# Patient Record
Sex: Female | Born: 1975 | Race: White | Hispanic: No | Marital: Married | Smoking: Never smoker
Health system: Southern US, Community
[De-identification: ages and names within clinical notes are randomized; demographics above are authoritative.]

## PROBLEM LIST (undated history)

## (undated) DIAGNOSIS — M041 Periodic fever syndromes: Secondary | ICD-10-CM

## (undated) DIAGNOSIS — M1990 Unspecified osteoarthritis, unspecified site: Secondary | ICD-10-CM

## (undated) HISTORY — DX: Unspecified osteoarthritis, unspecified site: M19.90

## (undated) HISTORY — DX: Periodic fever syndromes: M04.1

## (undated) HISTORY — PX: APPENDECTOMY: SHX54

---

## 2015-10-08 ENCOUNTER — Other Ambulatory Visit: Payer: Self-pay | Admitting: Obstetrics & Gynecology

## 2015-10-08 DIAGNOSIS — Z1231 Encounter for screening mammogram for malignant neoplasm of breast: Secondary | ICD-10-CM

## 2015-10-15 ENCOUNTER — Ambulatory Visit
Admission: RE | Admit: 2015-10-15 | Discharge: 2015-10-15 | Disposition: A | Discharge status: Home / Self Care | Payer: No Typology Code available for payment source | Source: Ambulatory Visit | Attending: Obstetrics & Gynecology | Admitting: Obstetrics & Gynecology

## 2015-10-15 DIAGNOSIS — Z1231 Encounter for screening mammogram for malignant neoplasm of breast: Secondary | ICD-10-CM

## 2015-10-16 ENCOUNTER — Other Ambulatory Visit: Payer: Self-pay | Admitting: Obstetrics & Gynecology

## 2015-10-16 DIAGNOSIS — R928 Other abnormal and inconclusive findings on diagnostic imaging of breast: Secondary | ICD-10-CM

## 2015-10-21 ENCOUNTER — Other Ambulatory Visit: Payer: Self-pay

## 2015-10-29 ENCOUNTER — Other Ambulatory Visit (HOSPITAL_COMMUNITY): Payer: Self-pay | Admitting: *Deleted

## 2015-10-29 DIAGNOSIS — R928 Other abnormal and inconclusive findings on diagnostic imaging of breast: Secondary | ICD-10-CM

## 2015-10-30 ENCOUNTER — Ambulatory Visit
Admission: RE | Admit: 2015-10-30 | Discharge: 2015-10-30 | Disposition: A | Discharge status: Home / Self Care | Payer: No Typology Code available for payment source | Source: Ambulatory Visit | Attending: Obstetrics and Gynecology | Admitting: Obstetrics and Gynecology

## 2015-10-30 ENCOUNTER — Encounter (HOSPITAL_COMMUNITY): Payer: Self-pay

## 2015-10-30 ENCOUNTER — Other Ambulatory Visit (HOSPITAL_COMMUNITY): Payer: Self-pay | Admitting: Obstetrics and Gynecology

## 2015-10-30 ENCOUNTER — Ambulatory Visit (HOSPITAL_COMMUNITY)
Admission: RE | Admit: 2015-10-30 | Discharge: 2015-10-30 | Disposition: A | Discharge status: Home / Self Care | Payer: No Typology Code available for payment source | Source: Ambulatory Visit | Attending: Obstetrics and Gynecology | Admitting: Obstetrics and Gynecology

## 2015-10-30 VITALS — BP 120/82 | Temp 98.3°F | Ht 64.0 in | Wt 123.0 lb

## 2015-10-30 DIAGNOSIS — R928 Other abnormal and inconclusive findings on diagnostic imaging of breast: Secondary | ICD-10-CM

## 2015-10-30 DIAGNOSIS — Z1239 Encounter for other screening for malignant neoplasm of breast: Secondary | ICD-10-CM

## 2015-10-30 NOTE — Progress Notes (Signed)
Patient referred to [facility] by the [facility] due to recommending additional imaging of the right breast. Screening mammogram completed 10/15/2015 at the Breast Center.  Pap Smear:  Pap smear not completed today. Last Pap smear was in September 2015 at [facility] and normal per patient. Per patient has no history of an abnormal Pap smear. No Pap smear results in EPIC.  Physical exam: Breasts Breasts symmetrical. No skin abnormalities bilateral breasts. No nipple retraction bilateral breasts. No nipple discharge bilateral breasts. No lymphadenopathy. No lumps palpated bilateral breasts. No complaints of pain or tenderness on exam. Referred patient to the [facility] of [facility] for right breast diagnostic mammogram per recommendation. Appointment scheduled for Thursday, October 30, 2015 at 1300.        Pelvic/Bimanual No Pap smear completed today since last Pap smear was in September 2015 per patient. Pap smear not indicated per BCCCP guidelines.   Smoking History: Patient has never smoked.  Patient Navigation: Patient education provided. Access to services provided for patient through [facility] program.

## 2015-10-30 NOTE — Patient Instructions (Signed)
Educational materials on breast self awareness given. Explained to [employer] that she did not need a Pap smear today due to last Pap smear was in September 2015 per patient. Let her know BCCCP will cover Pap smears every 3 years unless has a history of abnormal Pap smears. Referred patient to the [facility] of [facility] for right breast diagnostic mammogram per recommendation. Appointment scheduled for Thursday, October 30, 2015 at 1300. Patient aware of appointment and will be there. Mayzie Tiu verbalized understanding.  Sofiya Ezelle, Kathaleen Maser, RN 3:17 PM

## 2015-11-03 ENCOUNTER — Encounter (HOSPITAL_COMMUNITY): Payer: Self-pay | Admitting: *Deleted

## 2016-11-12 ENCOUNTER — Other Ambulatory Visit: Payer: Self-pay | Admitting: Obstetrics and Gynecology

## 2016-11-12 DIAGNOSIS — Z1231 Encounter for screening mammogram for malignant neoplasm of breast: Secondary | ICD-10-CM

## 2016-12-02 ENCOUNTER — Ambulatory Visit
Admission: RE | Admit: 2016-12-02 | Discharge: 2016-12-02 | Disposition: A | Discharge status: Home / Self Care | Payer: No Typology Code available for payment source | Source: Ambulatory Visit | Attending: Obstetrics and Gynecology | Admitting: Obstetrics and Gynecology

## 2016-12-02 ENCOUNTER — Encounter (HOSPITAL_COMMUNITY): Payer: Self-pay | Admitting: *Deleted

## 2016-12-02 ENCOUNTER — Ambulatory Visit (HOSPITAL_COMMUNITY)
Admission: RE | Admit: 2016-12-02 | Discharge: 2016-12-02 | Disposition: A | Discharge status: Home / Self Care | Payer: Self-pay | Source: Ambulatory Visit | Attending: Obstetrics and Gynecology | Admitting: Obstetrics and Gynecology

## 2016-12-02 VITALS — BP 110/70 | Temp 98.6°F | Ht 63.0 in | Wt 130.2 lb

## 2016-12-02 DIAGNOSIS — Z1239 Encounter for other screening for malignant neoplasm of breast: Secondary | ICD-10-CM

## 2016-12-02 DIAGNOSIS — Z1231 Encounter for screening mammogram for malignant neoplasm of breast: Secondary | ICD-10-CM

## 2016-12-02 NOTE — Progress Notes (Signed)
No complaints today.   Pap Smear:  Pap smear not completed today. Last Pap smear was in September 2015 at [facility] and normal per patient. Per patient has no history of an abnormal Pap smear. No Pap smear results in EPIC.  Physical exam: Breasts Breasts symmetrical. No skin abnormalities bilateral breasts. No nipple retraction bilateral breasts. No nipple discharge bilateral breasts. No lymphadenopathy. No lumps palpated bilateral breasts. No complaints of pain or tenderness on exam. Referred patient to the [facility] of [facility] for a screening mammogram. Appointment scheduled for Thursday, December 02, 2016 at 1540.        Pelvic/Bimanual No Pap smear completed today since last Pap smear was in September 2015 per patient. Pap smear not indicated per BCCCP guidelines.   Smoking History: Patient has never smoked.  Patient Navigation: Patient education provided. Access to services provided for patient through [facility] program.

## 2016-12-02 NOTE — Patient Instructions (Signed)
Explained breast self awareness with [name]. Patient did not need a Pap smear today due to last Pap smear was in September 2015 per patient. Let her know BCCCP will cover Pap smears every 3 years unless has a history of abnormal Pap smears and reminded her that her next Pap smear is due in September 2018. Told patient she call Sabrina to schedule her Pap smear with BCCCP. Referred patient to the [facility] of [facility] for a screening mammogram. Appointment scheduled for Thursday, December 02, 2016 at 1540. Let patient know the Breast Center will follow up with her within the next couple weeks with results of mammogram by letter or phone. Alice Brewer verbalized understanding.  Alice Brewer, Alice Maser, RN 3:42 PM

## 2016-12-03 ENCOUNTER — Encounter (HOSPITAL_COMMUNITY): Payer: Self-pay | Admitting: *Deleted

## 2017-01-10 ENCOUNTER — Ambulatory Visit: Payer: Self-pay | Admitting: Pediatrics

## 2018-01-20 ENCOUNTER — Other Ambulatory Visit: Payer: Self-pay | Admitting: Obstetrics and Gynecology

## 2018-01-20 DIAGNOSIS — Z1231 Encounter for screening mammogram for malignant neoplasm of breast: Secondary | ICD-10-CM

## 2018-02-07 ENCOUNTER — Ambulatory Visit
Admission: RE | Admit: 2018-02-07 | Discharge: 2018-02-07 | Disposition: A | Discharge status: Home / Self Care | Payer: No Typology Code available for payment source | Source: Ambulatory Visit | Attending: Obstetrics and Gynecology | Admitting: Obstetrics and Gynecology

## 2018-02-07 ENCOUNTER — Encounter (HOSPITAL_COMMUNITY): Payer: Self-pay

## 2018-02-07 ENCOUNTER — Ambulatory Visit (HOSPITAL_COMMUNITY)
Admission: RE | Admit: 2018-02-07 | Discharge: 2018-02-07 | Disposition: A | Discharge status: Home / Self Care | Payer: Self-pay | Source: Ambulatory Visit | Attending: Obstetrics and Gynecology | Admitting: Obstetrics and Gynecology

## 2018-02-07 VITALS — BP 127/80 | Ht 64.0 in | Wt 124.6 lb

## 2018-02-07 DIAGNOSIS — Z01419 Encounter for gynecological examination (general) (routine) without abnormal findings: Secondary | ICD-10-CM

## 2018-02-07 DIAGNOSIS — Z1231 Encounter for screening mammogram for malignant neoplasm of breast: Secondary | ICD-10-CM

## 2018-02-07 NOTE — Patient Instructions (Signed)
Explained breast self awareness with [name]. Let patient know BCCCP will cover Pap smears and HPV typing every 5 years unless has a history of abnormal Pap smears. Referred patient to the [facility] of [facility] for a screening mammogram. Appointment scheduled for Tuesday, Feb 07, 2018 at 1040. Let patient know will follow up with her within the next couple weeks with results of Pap smear by letter or phone. Informed patient that the Breast Center will follow-up with her within the next couple of weeks with results of mammogram by letter or phone. Deniya Whitcher verbalized understanding.  [place]ann[place], Kathaleen Maser, RN 9:47 AM

## 2018-02-07 NOTE — Progress Notes (Signed)
No complaints today.   Pap Smear: Pap smear completed today. Last Pap smear was in September 2015 at [facility] and normal per patient. Per patient has no history of an abnormal Pap smear. No Pap smear results in EPIC.  Physical exam: Breasts Breasts symmetrical. No skin abnormalities bilateral breasts. No nipple retraction bilateral breasts. No nipple discharge bilateral breasts. No lymphadenopathy. No lumps palpated bilateral breasts. No complaints of pain or tenderness on exam. Referred patient to the [facility] Center of [facility] for a screening mammogram. Appointment scheduled for Tuesday, Feb 07, 2018 at 1040.        Pelvic/Bimanual   Ext Genitalia No lesions, no swelling and no discharge observed on external genitalia.         Vagina Vagina pink and normal texture. No lesions or discharge observed in vagina.          Cervix Cervix is present. Cervix pink and of normal texture. No discharge observed.     Uterus Uterus is present and palpable. Uterus in normal position and normal size.        Adnexae Bilateral ovaries present and palpable. No tenderness on palpation.         Rectovaginal No rectal exam completed today since patient had no rectal complaints. No skin abnormalities observed on exam.    Smoking History: Patient has never smoked.  Patient Navigation: Patient education provided. Access to services provided for patient through BCCCP program.   Breast and Cervical Cancer Risk Assessment: Patient has no family history of breast cancer, known genetic mutations, or radiation treatment to the chest before age 45. Patient has no history of cervical dysplasia, immunocompromised, or DES exposure in-utero.

## 2018-02-08 LAB — CYTOLOGY - PAP
Diagnosis: NEGATIVE
HPV (WINDOPATH): NOT DETECTED

## 2018-03-03 ENCOUNTER — Encounter (HOSPITAL_COMMUNITY): Payer: Self-pay | Admitting: *Deleted

## 2018-03-08 ENCOUNTER — Other Ambulatory Visit (HOSPITAL_COMMUNITY): Payer: Self-pay | Admitting: *Deleted

## 2018-03-08 ENCOUNTER — Inpatient Hospital Stay: Payer: No Typology Code available for payment source | Attending: Obstetrics and Gynecology | Admitting: *Deleted

## 2018-03-08 ENCOUNTER — Inpatient Hospital Stay: Payer: No Typology Code available for payment source

## 2018-03-08 VITALS — BP 110/68 | Ht 63.0 in | Wt 125.5 lb

## 2018-03-08 DIAGNOSIS — Z0000 Encounter for general adult medical examination without abnormal findings: Secondary | ICD-10-CM

## 2018-03-08 LAB — LIPID PANEL
Cholesterol: 168 mg/dL (ref 0–200)
HDL: 68 mg/dL (ref >40)
LDL Cholesterol: 80 mg/dL (ref 0–99)
Total CHOL/HDL Ratio: 2.5 RATIO
Triglycerides: 100 mg/dL (ref <150)
VLDL: 20 mg/dL (ref 0–40)

## 2018-03-08 LAB — HEMOGLOBIN A1C
HEMOGLOBIN A1C: 5.1 % (ref 4.8–5.6)
Mean Plasma Glucose: 99.67 mg/dL

## 2018-03-08 NOTE — Addendum Note (Signed)
Addended by: Tyson Dense B on: 03/08/2018 12:12 PM   Modules accepted: Orders

## 2018-03-08 NOTE — Progress Notes (Signed)
Wisewoman initial screening  Clinical Measurement:  Height:  63in. Weight: 124.5lb  Blood Pressure: 110/70 Blood Pressure #2: 110/68  Fasting Labs Drawn Today, will review with patient when they result.  Patient states she did not fast.  Medical History:  Patient states that she has not been diagnosed with high cholesterol, high blood pressure, diabetes or heart disease.  Medications:  Patients states she is not taking any medications for high cholesterol, high blood pressure or diabetes.  She is not taking aspirin daily to prevent heart attack or stroke.    Blood pressure, self measurement:  Patients states she does not measure blood pressure at home.    Nutrition:  Patient states she eats 1 cup of fruit and 1 cup of vegetables in an average day.  Patient states she does not eat fish regularly, she eats more than half a serving of whole grains daily. She drinks less than 36 ounces of beverages with added sugar weekly.  She is currently watching her sodium intake.  She has not had any drinks containing alcohol in the last seven days.    Physical activity:  Patient states that she gets 150 minutes of moderate exercise in a week.  She gets 0 minutes of vigorous exercise per week.    Smoking status:  Patient states she has never smoked and is not around any smokers.  Quality of life:  Patient states that she has had 10 bad physical days out of the last 30 days. In the last 2 weeks, she has had 0 days that she has felt down or depressed. She has had 0 days in the last 2 weeks that she has had little interest or pleasure in doing things.    Risk reduction and counseling:  Patient states she wants to  increase fruit and vegetable intake.  I encouraged her to continue with current exercise regimen and increase vegetable and fruit intake.  Navigation:  I will notify patient of lab results.  Patient is aware of 2 more health coaching sessions and a follow up.

## 2018-03-08 NOTE — Addendum Note (Signed)
Addended by: Kesa Birky B on: 03/08/2018 12:12 PM   Modules accepted: Orders  

## 2018-03-10 ENCOUNTER — Telehealth (HOSPITAL_COMMUNITY): Payer: Self-pay | Admitting: *Deleted

## 2018-03-10 NOTE — Telephone Encounter (Signed)
Health Coaching 2  Labs- Cholesterol 168, Triglycerides  100, HDL 68 ,  LDL 80 , hemoglobin A1C   5.1, glucose 99.67    Patient is aware and understands these lab results.  Goal- Patient will continue her exercise regimen and diet of mostly fruits and vegetables.  Navigation- Navigation:  Patient is aware of 1 more health coaching session and a follow up.

## 2018-04-05 ENCOUNTER — Encounter (HOSPITAL_COMMUNITY): Payer: Self-pay | Admitting: *Deleted

## 2018-04-05 NOTE — Progress Notes (Signed)
Letter mailed to patient with negative pap smear results. HPV was negative. Next pap smear due in five years. 

## 2018-05-17 ENCOUNTER — Telehealth (HOSPITAL_COMMUNITY): Payer: Self-pay | Admitting: *Deleted

## 2018-05-17 NOTE — Telephone Encounter (Signed)
Health coaching 3  Goals-  Patient states she has lost 8lbs.  She states she does fast walking 180 minutes per week.  Patient states she also does 60 minutes of indoor bike riding.  Patient states she is eating at least 3 servings of fruits and vegetables .  I encouraged patient to continue her exercise regimen and healthy lifestyle change.  Navigation:   Patient is aware of a follow up session.  Time- 10 minutes

## 2019-03-02 ENCOUNTER — Other Ambulatory Visit (HOSPITAL_COMMUNITY): Payer: Self-pay | Admitting: *Deleted

## 2019-03-02 DIAGNOSIS — Z1231 Encounter for screening mammogram for malignant neoplasm of breast: Secondary | ICD-10-CM

## 2019-06-12 ENCOUNTER — Encounter (HOSPITAL_COMMUNITY): Payer: Self-pay

## 2019-06-12 ENCOUNTER — Ambulatory Visit
Admission: RE | Admit: 2019-06-12 | Discharge: 2019-06-12 | Disposition: A | Discharge status: Home / Self Care | Payer: No Typology Code available for payment source | Source: Ambulatory Visit | Attending: Obstetrics and Gynecology | Admitting: Obstetrics and Gynecology

## 2019-06-12 ENCOUNTER — Other Ambulatory Visit: Payer: Self-pay

## 2019-06-12 ENCOUNTER — Ambulatory Visit (HOSPITAL_COMMUNITY)
Admission: RE | Admit: 2019-06-12 | Discharge: 2019-06-12 | Disposition: A | Discharge status: Home / Self Care | Payer: No Typology Code available for payment source | Source: Ambulatory Visit | Attending: Obstetrics and Gynecology | Admitting: Obstetrics and Gynecology

## 2019-06-12 DIAGNOSIS — Z1231 Encounter for screening mammogram for malignant neoplasm of breast: Secondary | ICD-10-CM

## 2019-06-12 DIAGNOSIS — Z0000 Encounter for general adult medical examination without abnormal findings: Secondary | ICD-10-CM | POA: Insufficient documentation

## 2019-06-12 DIAGNOSIS — Z1239 Encounter for other screening for malignant neoplasm of breast: Secondary | ICD-10-CM | POA: Insufficient documentation

## 2019-06-12 NOTE — Patient Instructions (Signed)
Explained breast self awareness with Faythe Ghee. Patient did not need a Pap smear today due to last Pap smear and HPV typing was 02/07/2018. Let her know BCCCP will cover Pap smears and HPV typing every 5 years unless has a history of abnormal Pap smears. Referred patient to the [facility] for a screening mammogram. Appointment scheduled for Tuesday, June 02, 2019 at 1140. Patient aware of appointment and will be there. Let patient know the Breast Center will follow up with her within the next couple weeks with results of mammogram by letter or phone. Alfhild Hudock verbalized understanding.  Vencil Basnett, Arvil Chaco, RN 11:22 AM

## 2019-06-12 NOTE — Progress Notes (Signed)
No complaints today.   Pap Smear: Pap smear not completed today. Last Pap smear was 02/07/2018 at [facility] and normal with negative HPV. Per patient has no history of an abnormal Pap smear. Last Pap smear result is in Epic.  Physical exam: Breasts Breasts symmetrical. No skin abnormalities bilateral breasts. No nipple retraction bilateral breasts. No nipple discharge bilateral breasts. No lymphadenopathy. No lumps palpated bilateral breasts. Patient complained of mild left lower breast tenderness on exam. Referred patient to the [place] for a screening mammogram. Appointment scheduled for Tuesday, June 02, 2019 at 1140.        Pelvic/Bimanual No Pap smear completed today since last Pap smear and HPV typing was 02/07/2018. Pap smear not indicated per BCCCP guidelines.   Smoking History: Patient has never smoked.  Patient Navigation: Patient education provided. Access to services provided for patient through BCCCP program.   Breast and Cervical Cancer Risk Assessment: Patient has no family history of breast cancer, known genetic mutations, or radiation treatment to the chest before age 81. Patient has no history of cervical dysplasia, immunocompromised, or DES exposure in-utero.  Risk Assessment    Risk Scores      06/12/2019   Last edited by: Armond Hang, LPN   5-year risk: 0.9 %   Lifetime risk: 10.6 %

## 2019-06-13 ENCOUNTER — Encounter (HOSPITAL_COMMUNITY): Payer: Self-pay | Admitting: *Deleted

## 2019-06-20 ENCOUNTER — Inpatient Hospital Stay: Payer: Self-pay | Attending: Obstetrics and Gynecology | Admitting: *Deleted

## 2019-06-20 ENCOUNTER — Other Ambulatory Visit: Payer: Self-pay

## 2019-06-20 VITALS — BP 114/78 | Temp 96.9°F | Ht 63.75 in | Wt 126.0 lb

## 2019-06-20 DIAGNOSIS — Z0000 Encounter for general adult medical examination without abnormal findings: Secondary | ICD-10-CM

## 2019-06-20 NOTE — Progress Notes (Signed)
Wisewoman initial screening    Clinical Measurement:  Height: 63.75 in Weight: 126lb  Blood Pressure: 128/86  Blood Pressure #2: 114/78 Fasting Labs Drawn Today, will review with patient when they result.   Medical History:  Patient states that she does not have a history of high cholesterol, high blood pressure or diabetes.  Medications:  Patient states that she does not take  medication to lower cholesterol, blood pressure or blood sugar.  Patient does not take an aspirin a day to help prevent a heart attack or stroke.    Blood pressure, self measurement: Patient states that she does not measure blood pressure from home and has not been told to do so by a health care provider.   Nutrition: Patient states that on average she eats 3 cups of fruit and 3 cups of vegetables per day. Patient states that she does not eat fish at least 2 times per week. Patient eats more than half servings of whole grains. Patient drinks less than 36 ounces of beverages with added sugar weekly. Patient is currently watching sodium or salt intake. In the past 7 days patient has drank alcohol on 1 day. On an average day that patient drinks alcohol she drinks 3 alcoholic drinks.  Physical activity:  Patient states that she gets 60 minutes of moderate and 0 minutes of vigorous physical activity each week.  Smoking status:  Patient states that she has never smoked tobacco.   Quality of life:  Over the past 2 weeks patient states that she has not had any days where she has little interest or pleasure in doing things and 0 days where she has felt down, depressed or hopeless.    Risk reduction and counseling:    Health Coaching: Encouraged patient to try and eat heart healthy fish 2 times a week. Gave suggestion to try salmon, tuna or mackerel. Also encouraged patient to try and walk for 20 minutes a day.   Navigation:  I will notify patient of lab results.  Patient is aware of 2 more health coaching sessions and a follow  up.  Time: 20 minutes

## 2019-06-21 LAB — LIPID PANEL W/O CHOL/HDL RATIO
Cholesterol, Total: 169 mg/dL (ref 100–199)
HDL: 69 mg/dL (ref >39)
LDL Chol Calc (NIH): 87 mg/dL (ref 0–99)
Triglycerides: 70 mg/dL (ref 0–149)
VLDL Cholesterol Cal: 13 mg/dL (ref 5–40)

## 2019-06-21 LAB — HGB A1C W/O EAG: Hgb A1c MFr Bld: 5 % (ref 4.8–5.6)

## 2019-06-21 LAB — GLUCOSE, RANDOM: Glucose: 93 mg/dL (ref 65–99)

## 2019-06-26 ENCOUNTER — Telehealth (HOSPITAL_COMMUNITY): Payer: Self-pay

## 2019-06-26 NOTE — Telephone Encounter (Signed)
Health coaching 2     Labs-169 cholesterol , 87 LDL cholesterol , 70 triglycerides , 69 HDL cholesterol , 5.0 hemoglobin A1C , 93 mean plasma glucose  Patient understands and is aware of her lab results.   Goals- Discussed lab results with patient. Answered any questions that parents had regarding results.  Goals- Increase fish servings to 2 servings of heart healthy fish per week. Recommended trying salmon, tuna or mackerel. Increase daily walking to 20 minutes a day for a total of 150 minutes per week.   Navigation:  Patient is aware of 1 more health coaching sessions and a follow up.   Time- 10 minutes

## 2019-11-07 ENCOUNTER — Encounter: Payer: No Typology Code available for payment source | Admitting: Family Medicine

## 2019-11-12 ENCOUNTER — Ambulatory Visit: Payer: No Typology Code available for payment source | Admitting: Family Medicine

## 2019-12-05 ENCOUNTER — Ambulatory Visit: Payer: No Typology Code available for payment source | Admitting: Family Medicine

## 2020-01-07 ENCOUNTER — Telehealth: Payer: Self-pay

## 2020-01-07 NOTE — Telephone Encounter (Signed)
Left message for patient about completing HC 3 for the Wise Woman program. Left name and number for patient to call back. 

## 2020-01-31 ENCOUNTER — Telehealth: Payer: Self-pay

## 2020-01-31 NOTE — Telephone Encounter (Signed)
Health Coaching 3   Goals- Patient stated that she has recently joined a [employer] with her husband. She has been going to the gym 4-5 days a week for 45 minutes at a time. Patient stated that she has mainly been doing the treadmill and rowing machine. Encouraged patient to keep up the good work.   New goal- Patient will continue watching diet and exercising.  Barrier to reaching goal- NA   Strategies to overcome- NA   Navigation:  Patient is aware of  a follow up session. Patient is scheduled for follow-up appointment on Wednesday, June 9th @ 9:45 am.   Time- 10 minutes

## 2020-03-05 ENCOUNTER — Other Ambulatory Visit: Payer: Self-pay

## 2020-03-05 ENCOUNTER — Inpatient Hospital Stay: Payer: Self-pay | Attending: Obstetrics and Gynecology | Admitting: *Deleted

## 2020-03-05 VITALS — BP 112/76 | Temp 99.5°F | Ht 63.75 in | Wt 124.0 lb

## 2020-03-05 DIAGNOSIS — Z0000 Encounter for general adult medical examination without abnormal findings: Secondary | ICD-10-CM

## 2020-03-05 NOTE — Progress Notes (Signed)
Alice Brewer follow up   Clinical Measurement:   Vitals:   03/05/20 0958 03/05/20 1115  BP: 116/78 112/76  Temp: 99.5 F (37.5 C)       Medical History:  Patient states that she does not have high cholesterol, does not have high blood pressure and she does not have diabetes.  Medications:  Patient states that she does not take medication to lower cholesterol, blood pressure and blood sugar.  Patient does not take an aspirin a day to help prevent a heart attack or stroke. Patient does not take any medication to lower cholesterol, blood pressure or blood sugar.   Blood pressure, self measurement: Patient states that she does not measure blood pressure from home. She checks her blood pressure N/A. She shares her readings with a health care provider: N/A.   Nutrition: Patient states that on average she eats 1 cups of fruit and 2 cups of vegetables per day. Patient states that she does not eat fish at least 2 times per week. Patient eats about half servings of whole grains. Patient drinks less than 36 ounces of beverages with added sugar weekly: yes. Patient is currently watching sodium or salt intake: yes. In the past 7 days patient has had 1 drinks containing alcohol. On average patient drinks 2 drinks containing alcohol per day.      Physical activity:  Patient states that she gets 60 minutes of moderate and 60 minutes of vigorous physical activity each week.  Smoking status:  Patient states that she has has never smoked .   Quality of life:  Over the past 2 weeks patient states that she had little interest or pleasure in doing things: not at all. She has been feeling down, depressed or hopeless:not at all.    Risk reduction and counseling:   Health Coaching: Discussed with patient about adding more fruits and vegetables into daily diet. Encouraged her to add an extra serving of fruits and vegetables. Patient currently eats fish 1 time a week but has started taking an omega-3 supplement.  Patient has joined a gym with her husband recently and has been exercising 3 days a week there. Encouraged her to keep up this good work.    Navigation: This was the  follow up session for this patient, I will check up on her progress in the coming months.  Time: 20 minutes

## 2020-06-16 ENCOUNTER — Other Ambulatory Visit: Payer: Self-pay

## 2020-06-16 DIAGNOSIS — Z1231 Encounter for screening mammogram for malignant neoplasm of breast: Secondary | ICD-10-CM

## 2020-07-10 ENCOUNTER — Ambulatory Visit: Payer: Self-pay | Admitting: *Deleted

## 2020-07-10 ENCOUNTER — Other Ambulatory Visit: Payer: Self-pay

## 2020-07-10 ENCOUNTER — Ambulatory Visit
Admission: RE | Admit: 2020-07-10 | Discharge: 2020-07-10 | Disposition: A | Discharge status: Home / Self Care | Payer: No Typology Code available for payment source | Source: Ambulatory Visit | Attending: Obstetrics and Gynecology | Admitting: Obstetrics and Gynecology

## 2020-07-10 VITALS — BP 116/68 | Temp 98.9°F | Wt 125.6 lb

## 2020-07-10 DIAGNOSIS — Z1239 Encounter for other screening for malignant neoplasm of breast: Secondary | ICD-10-CM

## 2020-07-10 DIAGNOSIS — Z1231 Encounter for screening mammogram for malignant neoplasm of breast: Secondary | ICD-10-CM

## 2020-07-10 NOTE — Progress Notes (Signed)
[name] is a 44 y.o. female who presents to [facility] clinic today with complaint of mild left lower outer breast tenderness since prior to her previous mammogram 06/12/2019.    Pap Smear: Pap smear not completed today. Last Pap smear was 02/07/2018 at [facility] and normal with negative HPV. Per patient has no history of an abnormal Pap smear. Last Pap smear result is in Epic.   Physical exam: Breasts Breasts symmetrical. No skin abnormalities bilateral breasts. No nipple retraction bilateral breasts. No nipple discharge bilateral breasts. No lymphadenopathy. No lumps palpated bilateral breasts. Patient complained of mild left lower breast tenderness on exam.   Pelvic/Bimanual Pap is not indicated today per BCCCP guidelines.   Smoking History: Patient has never smoked.   Patient Navigation: Patient education provided. Access to services provided for patient through [facility] program. Transportation provided home to patient following appointment.   Breast and Cervical Cancer Risk Assessment: Patient does not have family history of breast cancer, known genetic mutations, or radiation treatment to the chest before age 66. Patient does not have history of cervical dysplasia, immunocompromised, or DES exposure in-utero.  Risk Assessment    Risk Scores      07/10/2020 06/12/2019   Last edited by: Priscille Heidelberg, RN Lynnell Dike, LPN   5-year risk: 0.9 % 0.9 %   Lifetime risk: 10.5 % 10.6 %          A: BCCCP exam without pap smear Complaints of mild outer and lower left breast pain.  P: Referred patient to the [facility] Center of [facility] for a screening mammogram on the mobile unit. Appointment scheduled Thursday, July 10, 2020 at 1300.  Priscille Heidelberg, RN 07/10/2020 1:36 PM

## 2020-07-10 NOTE — Patient Instructions (Signed)
Explained breast self awareness with Alice Brewer. Patient did not need a Pap smear today due to last Pap smear and HPV typing was 02/07/2018. Let her know BCCCP will cover Pap smears and HPV typing every 5 years unless has a history of abnormal Pap smears. Referred patient to the [facility] of [facility] for a screening mammogram on the mobile unit. Appointment scheduled Thursday, July 10, 2020 at 1300. Patient escorted to mobile unit following BCCCP appointment for her screening mammogram. Let patient know the Breast Center will follow up with her within the next couple weeks with results of her mammogram by letter or phone. Alice Brewer verbalized understanding.  Alice Brewer, Kathaleen Maser, RN 1:36 PM

## 2020-08-06 ENCOUNTER — Ambulatory Visit: Payer: No Typology Code available for payment source

## 2020-08-06 ENCOUNTER — Other Ambulatory Visit: Payer: No Typology Code available for payment source

## 2020-08-13 ENCOUNTER — Other Ambulatory Visit: Payer: No Typology Code available for payment source

## 2020-08-13 ENCOUNTER — Ambulatory Visit: Payer: No Typology Code available for payment source

## 2020-08-18 ENCOUNTER — Other Ambulatory Visit: Payer: Self-pay

## 2020-08-18 ENCOUNTER — Inpatient Hospital Stay: Payer: Self-pay | Attending: Obstetrics and Gynecology | Admitting: *Deleted

## 2020-08-18 VITALS — BP 138/90 | Ht 63.75 in | Wt 128.4 lb

## 2020-08-18 DIAGNOSIS — Z0000 Encounter for general adult medical examination without abnormal findings: Secondary | ICD-10-CM

## 2020-08-18 NOTE — Progress Notes (Signed)
Wisewoman initial screening     Clinical Measurement:  Vitals:   08/18/20 0828  BP: 138/90   Fasting Labs Drawn Today, will review with patient when they result.   Medical History:  Patient states that she does not have high cholesterol, does not have high blood pressure and she does not have diabetes.  Medications:  Patient states that she does not take medication to lower cholesterol, blood pressure or blood sugar. Patient does not take an aspirin a day to help prevent a heart attack or stroke.    Blood pressure, self measurement: Patient states that she does not measure blood pressure from home. She checks her blood pressure N/A. She shares her readings with a health care provider: N/A.   Nutrition: Patient states that on average she eats 1 cups of fruit and 0 cups of vegetables per day. Patient states that she does not eat fish at least 2 times per week. Patient eats less than half servings of whole grains. Patient drinks less than 36 ounces of beverages with added sugar weekly: yes. Patient is currently watching sodium or salt intake: yes. In the past 7 days patient has had 1 drinks containing alcohol. On average patient drinks 2 drinks containing alcohol per day.      Physical activity:  Patient states that she gets 60 minutes of moderate and 0 minutes of vigorous physical activity each week.  Smoking status:  Patient states that she has has never smoked .   Quality of life:  Over the past 2 weeks patient states that she had little interest or pleasure in doing things: not at all. She has been feeling down, depressed or hopeless:not at all.    Risk reduction and counseling:   Health Coaching:  Discussed with patient the daily recommendation for fruits and vegetables. Discussed the importance of heart healthy fish (salmon, tuna, mackerel, sardines or sea bass) and whole grains (brown rice, oatmeal, whole grain cereals, whole wheat bread or pasta) in a heart healthy diet. Gave examples  of each to patient to try. Encouraged patient to continue trying to add daily exercise.   Navigation:  I will notify patient of lab results.  Patient is aware of 2 more health coaching sessions and a follow up. Will refer patient to follow-up for elevated BP reading. Will give patient follow-up appointment information once appointment is scheduled with Internal Medicine.  Time: 20 minutes

## 2020-08-19 LAB — HEMOGLOBIN A1C
Est. average glucose Bld gHb Est-mCnc: 103 mg/dL
Hgb A1c MFr Bld: 5.2 % (ref 4.8–5.6)

## 2020-08-19 LAB — LIPID PANEL
Chol/HDL Ratio: 2.4 ratio (ref 0.0–4.4)
Cholesterol, Total: 156 mg/dL (ref 100–199)
HDL: 64 mg/dL (ref >39)
LDL Chol Calc (NIH): 78 mg/dL (ref 0–99)
Triglycerides: 75 mg/dL (ref 0–149)
VLDL Cholesterol Cal: 14 mg/dL (ref 5–40)

## 2020-08-19 LAB — GLUCOSE, RANDOM: Glucose: 90 mg/dL (ref 65–99)

## 2020-08-25 ENCOUNTER — Telehealth: Payer: Self-pay

## 2020-08-25 NOTE — Telephone Encounter (Signed)
Health coaching 2     Labs- 156 cholesterol, 78 LDL cholesterol, 75 triglycerides, 64 HDL cholesterol, 5.2 hemoglobin A1C, 90 mean plasma glucose. Patient understands and is aware of her lab results.   Goals-  1. Increase the amount of fruits and vegetables consumed daily. With a goal of 2 fruits and 3 vegetables per day. 2. Try to add heart healthy fish in weekly diet. Suggestions include: salmon, tuna, mackerel, sardines or sea bass. 3. Increase the amount of whole grains consumed (brown rice, whole wheat bread or pasta, whole grain cereals or oatmeal).   Navigation:  Patient is aware of 1 more health coaching sessions and a follow up. Referred patient to Internal Medicine for elevated diastolic reading during screening visit. Will call patient with follow-up appointment information once appointment is scheduled.   Time- 10 minutes

## 2020-08-26 ENCOUNTER — Telehealth: Payer: Self-pay

## 2020-08-26 NOTE — Telephone Encounter (Signed)
Spoke with patient about follow-up appointment with Internal Medicine. Patient is scheduled for December 7 @ 2:15 pm. Will set up transportation for patient.

## 2020-09-02 ENCOUNTER — Encounter: Payer: Self-pay | Admitting: Internal Medicine

## 2020-09-02 ENCOUNTER — Ambulatory Visit (INDEPENDENT_AMBULATORY_CARE_PROVIDER_SITE_OTHER): Payer: Self-pay | Admitting: Internal Medicine

## 2020-09-02 ENCOUNTER — Other Ambulatory Visit: Payer: Self-pay

## 2020-09-02 VITALS — BP 144/88 | HR 63 | Temp 97.6°F | Ht 64.0 in | Wt 126.4 lb

## 2020-09-02 DIAGNOSIS — M041 Periodic fever syndromes: Secondary | ICD-10-CM

## 2020-09-02 DIAGNOSIS — I10 Essential (primary) hypertension: Secondary | ICD-10-CM

## 2020-09-02 DIAGNOSIS — Z0000 Encounter for general adult medical examination without abnormal findings: Secondary | ICD-10-CM

## 2020-09-02 DIAGNOSIS — R030 Elevated blood-pressure reading, without diagnosis of hypertension: Secondary | ICD-10-CM | POA: Insufficient documentation

## 2020-09-02 NOTE — Assessment & Plan Note (Signed)
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 No consistent symptoms with FMF except for possible arthritic symptoms but these are not monoarthritic. No fever, abdominal pain, chest pain, rash. She does have leg pain sometimes but this is not associated with exertion but with swelling when she eats high salt foods.   - check UA

## 2020-09-02 NOTE — Assessment & Plan Note (Signed)
BP Readings from Last 3 Encounters:  09/02/20 (!) 144/88  08/18/20 138/90  07/10/20 116/68   She presents today for high blood pressure during recent [facility] office visit of 138/90, prior to this blood pressure has been in the 110s. Today 144/88 and repeat similar. Discussed starting medication. She states she was stressed coming in walked around for an hour before finding the office. Also has had increased caffeine intake with her normal 4 8oz of coffee she has started drinking black tea. She also feels like she gets swelling when she eats salt, last night had a lot and feels bloated today. She has family history of hypertension in both of her parents, and diabetes and PVD in her father.   - She will likely need to start blood pressure medication, but we will wait two weeks and have her follow-up for repeat blood pressure check. Recommended she buy bp cuff at home and check daily and bring in readings.  - bmp, tsh, UA

## 2020-09-02 NOTE — Progress Notes (Signed)
   CC: high blood pressure  HPI:  [name] is a 44 y.o. with PMH as below.   Please see A&P for assessment of the patient's acute and chronic medical conditions.   She presents today for high blood pressure during recent [facility] office visit of 138/90, prior to this blood pressure has been in the 110s. Today 144/88 and repeat similar. Discussed starting medication. She states she was stressed coming in walked around for an hour before finding the office. Also has had increased caffeine intake with her normal 4 8oz of coffee she has started drinking black tea. She also feels like she gets swelling when she eats salt, last night had a lot and feels bloated today. She has family history of hypertension in both of her parents, and diabetes and PVD in her father.   Past Medical History:  Diagnosis Date  . Arthritis   . FMF (familial Mediterranean fever) (HCC)    Mom - HLD, HTN Dad- HTN, TIIDM, possibly some type of vascular disease Brother - HTN  Personal history - arthritis, FMF from 73 & me  Social history - does not use tobacco, drinks 2-3 glasses of wine on the weekend. Originally from [place], [place]. 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   Review of Systems:   Review of Systems  Constitutional: Negative for chills, diaphoresis, fever, malaise/fatigue and weight loss.  Respiratory: Negative for cough, shortness of breath and wheezing.   Cardiovascular: Positive for leg swelling. Negative for chest pain and palpitations.  Gastrointestinal: Negative for abdominal pain, blood in stool, constipation, diarrhea, heartburn, melena, nausea and vomiting.  Musculoskeletal: Positive for joint pain. Negative for falls.       Leg pain when she eats salt  Skin: Negative for rash.  Neurological: Negative for dizziness and weakness.  Psychiatric/Behavioral: Negative for depression.    Physical Exam:  Constitution: NAD, appears stated age HENT: /AT Eyes: no icterus or  injection Cardio: RRR, no m/r/g, no LE edema  Respiratory: clear to auscultation bilaterally  Abdominal: NTTP, soft, non-distended, normal bowel sounds MSK: moving all extremities, strength symmetrical  Neuro: normal affect, a&ox3, pleasant  Skin: c/d/i    Vitals:   09/02/20 1518  BP: (!) 143/89  Pulse: 76  Temp: 97.6 F (36.4 C)  TempSrc: Oral  SpO2: 100%  Weight: 126 lb 6.4 oz (57.3 kg)  Height: 5\' 4"  (1.626 m)     Assessment & Plan:   See Encounters Tab for problem based charting.  Patient discussed with Dr. 

## 2020-09-02 NOTE — Progress Notes (Deleted)
   Office Visit   Patient ID: Alice Brewer, female    DOB: [birth year], 44 y.o.   MRN: [id number]  Subjective:  CC: wise woman check  HPI 44 y.o. presents today for follow up of her [facility] woman labs.      ACTIVE MEDICATIONS   Current Outpatient Medications on File Prior to Visit  Medication Sig Dispense Refill  . Multiple Vitamin (MULTIVITAMIN) tablet Take 1 tablet by mouth daily.     No current facility-administered medications on file prior to visit.    ROS  Review of Systems  Objective:   There were no vitals taken for this visit. Wt Readings from Last 3 Encounters:  08/18/20 128 lb 6.4 oz (58.2 kg)  07/10/20 125 lb 9.6 oz (57 kg)  03/05/20 124 lb (56.2 kg)   BP Readings from Last 3 Encounters:  08/18/20 138/90  07/10/20 116/68  03/05/20 112/76   Physical Exam  Health Maintenance:   Health Maintenance  Topic Date Due  . Hepatitis C Screening  Never done  . HIV Screening  Never done  . TETANUS/TDAP  Never done  . INFLUENZA VACCINE  Never done  . PAP SMEAR-Modifier  02/07/2021     Assessment & Plan:   Problem List Items Addressed This Visit    None        Pt discussed with ***  Elige Radon, MD Internal Medicine Resident PGY-2 [facility] Pager: [phone] 09/02/2020 12:11 PM

## 2020-09-02 NOTE — Patient Instructions (Signed)
Thank you for allowing [place] to provide your care today. Today we discussed your high blood pressure    I have ordered the following labs for you:   Basic metabolic panel, thyroid test, and urinalysis    I will call if any are abnormal.    Today we made the following changes to your medications:   Please follow-up in two weeks for blood pressure check.    Please call the internal medicine center clinic if you have any questions or concerns, we may be able to help and keep you from a long and expensive emergency room wait. Our clinic and after hours phone number is [phone], the best time to call is Monday through Friday 9 am to 4 pm but there is always someone available 24/7 if you have an emergency. If you need medication refills please notify your pharmacy one week in advance and they will send [place] a request.

## 2020-09-03 ENCOUNTER — Encounter: Payer: Self-pay | Admitting: Internal Medicine

## 2020-09-03 LAB — BMP8+ANION GAP
Anion Gap: 17 mmol/L (ref 10.0–18.0)
BUN/Creatinine Ratio: 17 (ref 9–23)
BUN: 14 mg/dL (ref 6–24)
CO2: 23 mmol/L (ref 20–29)
Calcium: 9.9 mg/dL (ref 8.7–10.2)
Chloride: 102 mmol/L (ref 96–106)
Creatinine, Ser: 0.83 mg/dL (ref 0.57–1.00)
GFR calc Af Amer: 98 mL/min/{1.73_m2} (ref >59)
GFR calc non Af Amer: 85 mL/min/{1.73_m2} (ref >59)
Glucose: 107 mg/dL — ABNORMAL HIGH (ref 65–99)
Potassium: 3.7 mmol/L (ref 3.5–5.2)
Sodium: 142 mmol/L (ref 134–144)

## 2020-09-03 LAB — URINALYSIS, ROUTINE W REFLEX MICROSCOPIC
Bilirubin, UA: NEGATIVE
Glucose, UA: NEGATIVE
Ketones, UA: NEGATIVE
Leukocytes,UA: NEGATIVE
Nitrite, UA: NEGATIVE
Protein,UA: NEGATIVE
RBC, UA: NEGATIVE
Specific Gravity, UA: 1.017 (ref 1.005–1.030)
Urobilinogen, Ur: 0.2 mg/dL (ref 0.2–1.0)
pH, UA: 7.5 (ref 5.0–7.5)

## 2020-09-03 LAB — TSH: TSH: 1.23 u[IU]/mL (ref 0.450–4.500)

## 2020-09-03 NOTE — Progress Notes (Signed)
Internal Medicine Clinic Attending  Case discussed with Dr. Seawell  At the time of the visit.  We reviewed the resident's history and exam and pertinent patient test results.  I agree with the assessment, diagnosis, and plan of care documented in the resident's note.  

## 2020-09-16 ENCOUNTER — Encounter: Payer: Self-pay | Admitting: Internal Medicine

## 2020-09-16 ENCOUNTER — Other Ambulatory Visit: Payer: Self-pay

## 2020-09-16 ENCOUNTER — Ambulatory Visit (INDEPENDENT_AMBULATORY_CARE_PROVIDER_SITE_OTHER): Payer: Self-pay | Admitting: Internal Medicine

## 2020-09-16 DIAGNOSIS — I10 Essential (primary) hypertension: Secondary | ICD-10-CM

## 2020-09-16 NOTE — Progress Notes (Signed)
   CC: high blood pressure  HPI:  [name] is a 43 y.o. with PMH as below.   Please see A&P for assessment of the patient's acute and chronic medical conditions.   Past Medical History:  Diagnosis Date  . Arthritis   . FMF (familial Mediterranean fever) (HCC)    Review of Systems:  Review of Systems  HENT: Negative for congestion and sore throat.   Respiratory: Negative for cough and wheezing.   Cardiovascular: Negative for chest pain and leg swelling.  Neurological: Negative for dizziness, tingling and headaches.  Psychiatric/Behavioral: Negative for depression. The patient is nervous/anxious.    Physical Exam:  Constitution: NAD, appears stated age Cardio: RRR, no m/r/g, no LE edema  Respiratory: CTA, no w/r/r MSK: moving all extremities Neuro: normal affect, a&ox3 Skin: c/d/i    Vitals:   09/16/20 0854 09/16/20 0914  BP: (!) 142/81 123/87  Pulse: 74 66  Temp: 98.4 F (36.9 C)   TempSrc: Oral   SpO2: 100%   Weight:  123 lb 1.6 oz (55.8 kg)  Height:  5\' 4"  (1.626 m)    Assessment & Plan:   See Encounters Tab for problem based charting.  Patient discussed with Dr. 

## 2020-09-16 NOTE — Patient Instructions (Addendum)
Thank you for allowing [place] to provide your care today. Today we discussed your high blood pressure     Please follow-up in one year for blood pressure check and yearly check up.    Please call the internal medicine center clinic if you have any questions or concerns, we may be able to help and keep you from a long and expensive emergency room wait. Our clinic and after hours phone number is [phone], the best time to call is Monday through Friday 9 am to 4 pm but there is always someone available 24/7 if you have an emergency. If you need medication refills please notify your pharmacy one week in advance and they will send [place] a request.

## 2020-09-16 NOTE — Assessment & Plan Note (Signed)
BP Readings from Last 3 Encounters:  09/16/20 123/87  09/02/20 (!) 144/88  08/18/20 138/90   Here for blood pressure recheck. She is very nervous about having her blood pressure check. Initial 144/88. Repeat 123/87. She has similar readings at home.   - continue primary prevention, discussed diet and exercise - f/u in one year.

## 2020-09-17 NOTE — Progress Notes (Signed)
Internal Medicine Clinic Attending  Case discussed with Dr. Seawell  At the time of the visit.  We reviewed the resident's history and exam and pertinent patient test results.  I agree with the assessment, diagnosis, and plan of care documented in the resident's note.  

## 2020-12-17 ENCOUNTER — Ambulatory Visit: Payer: No Typology Code available for payment source

## 2021-03-31 ENCOUNTER — Encounter: Payer: Self-pay | Admitting: *Deleted

## 2021-04-08 ENCOUNTER — Ambulatory Visit: Payer: No Typology Code available for payment source

## 2021-04-15 ENCOUNTER — Inpatient Hospital Stay: Payer: Self-pay | Attending: Obstetrics and Gynecology | Admitting: *Deleted

## 2021-04-15 ENCOUNTER — Other Ambulatory Visit: Payer: Self-pay

## 2021-04-15 VITALS — BP 151/94 | Ht 63.75 in | Wt 120.6 lb

## 2021-04-15 DIAGNOSIS — Z0000 Encounter for general adult medical examination without abnormal findings: Secondary | ICD-10-CM

## 2021-04-15 DIAGNOSIS — Z1231 Encounter for screening mammogram for malignant neoplasm of breast: Secondary | ICD-10-CM

## 2021-04-15 NOTE — Progress Notes (Signed)
[facility] follow up  Clinical Measurement:   Vitals:   04/15/21 1012  BP: (!) 150/90      Medical History:  Patient states that she does not have high cholesterol, does not know if she has high blood pressure and she does not have diabetes.  Medications:  Patient states that she does not take medication to lower cholesterol, blood pressure and blood sugar.  Patient does not take an aspirin a day to help prevent a heart attack or stroke.    Blood pressure, self measurement:  :  Patient states that she does measure blood pressure from home. She checks her blood pressure one time per day. She shares her readings with a health care provider: no.   Nutrition: Patient states that on average she eats 3 cups of fruit and 1 cups of vegetables per day. Patient states that she does not eat fish at least 2 times per week. Patient eats less than half servings of whole grains. Patient drinks less than 36 ounces of beverages with added sugar weekly: yes. Patient is currently watching sodium or salt intake: yes. In the past 7 days patient has had drinks containing alcohol on one day. On average patient drinks 2 drinks containing alcohol per day.      Physical activity:  Patient states that she gets 90 minutes of moderate and 0 minutes of vigorous physical activity each week.  Smoking status:  Patient states that she has has never smoked .   Quality of life:  Over the past 2 weeks patient states that she had little interest or pleasure in doing things: not at all. She has been feeling down, depressed or hopeless:not at all.    Risk reduction and counseling:   Health Coaching: Spoke with patient about the daily recommendation for fruits and vegetables. Patient states that she consumes fish at times but not regularly. Gave examples of heart healthy fish that she can try adding in diet (salmon, tuna, mackerel, sardines, sea bass or trout). Patient does not consume whole grains regularly. Gave suggestions for  whole wheat bread or pasta, brown rice, oatmeal or whole grain cereals. Patient has started going back to the gym again. She has been going to the gym 3 times a week for 30 minutes.    Navigation: This was the  follow up session for this patient, I will check up on her progress in the coming months. Will refer patient to Internal Medicine for follow-up for elevated blood pressure   Time: 20 minutes

## 2021-04-16 ENCOUNTER — Telehealth: Payer: Self-pay

## 2021-04-16 IMAGING — MG DIGITAL SCREENING BILAT W/ TOMO W/ CAD
8 series · 8 of 24 positions shown · non-contrast
Comparison: Previous exam(s).

CLINICAL DATA: Screening.

EXAM:
DIGITAL SCREENING BILATERAL MAMMOGRAM WITH TOMO AND CAD

[R CC synth-2D]
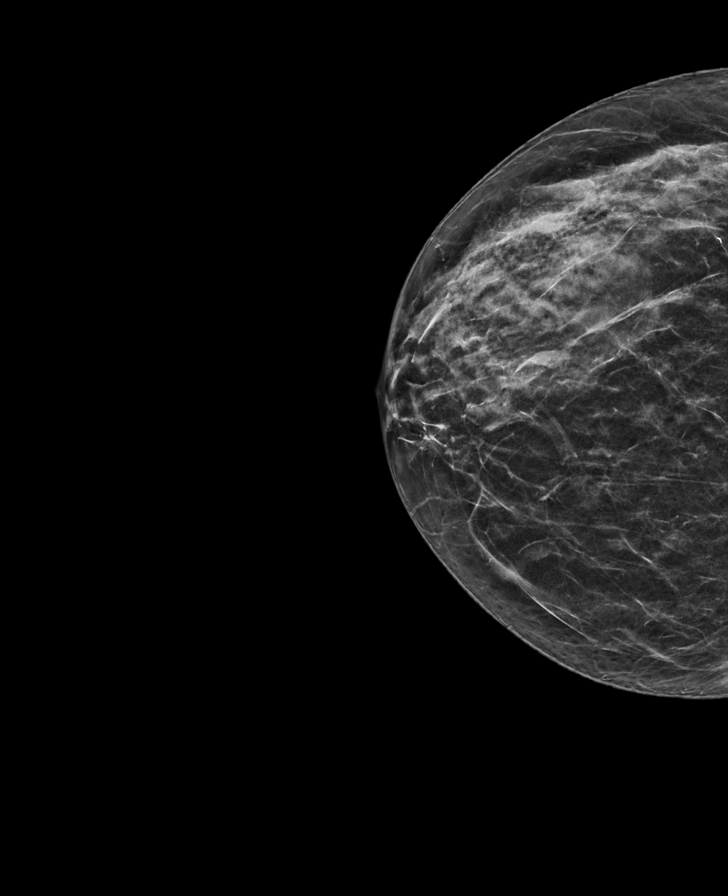

[L MLO synth-2D]
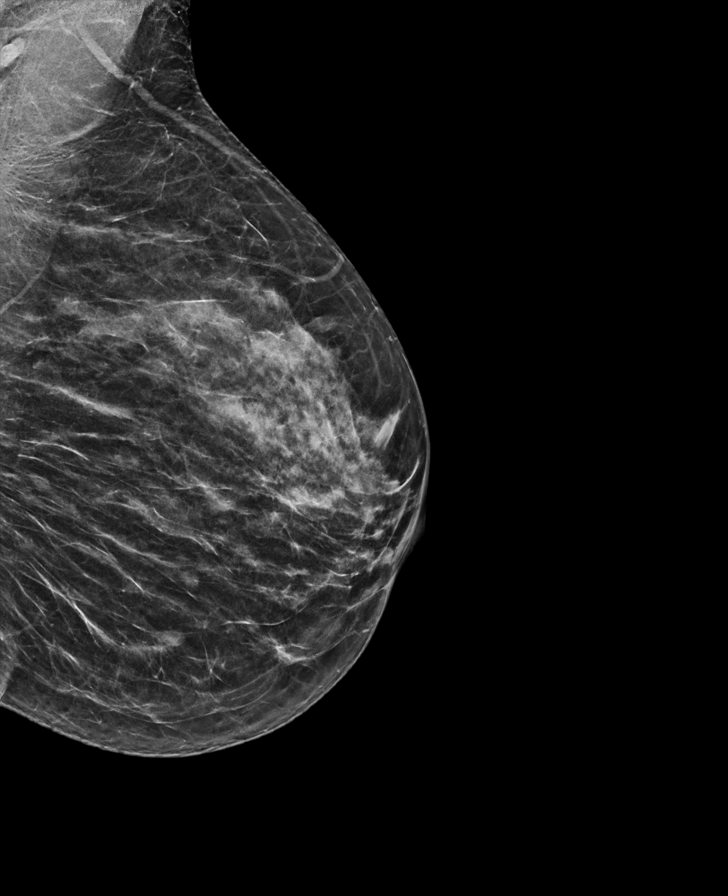

[L CC synth-2D]
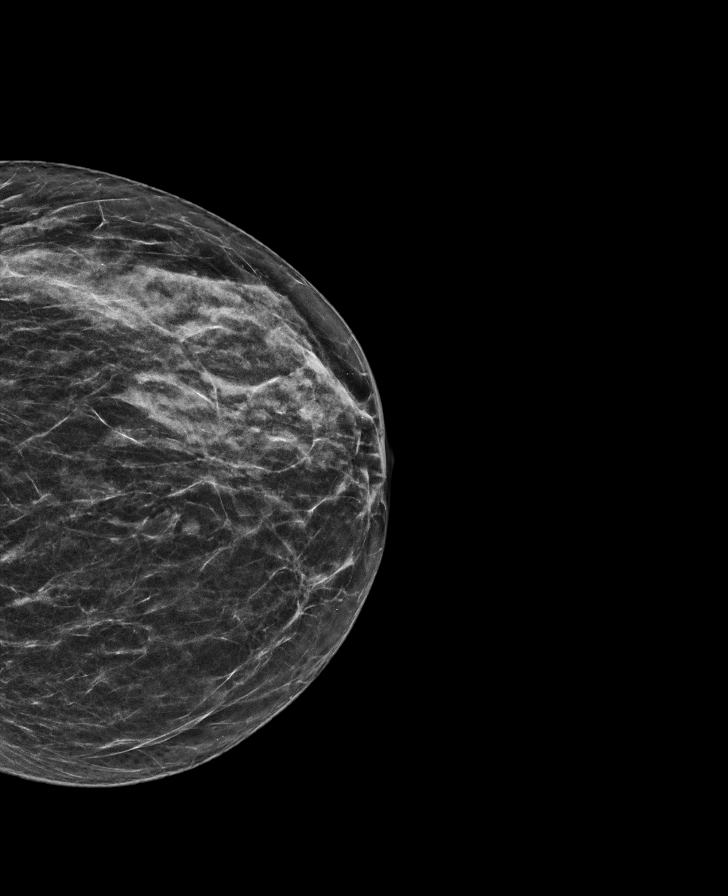

[R MLO synth-2D]
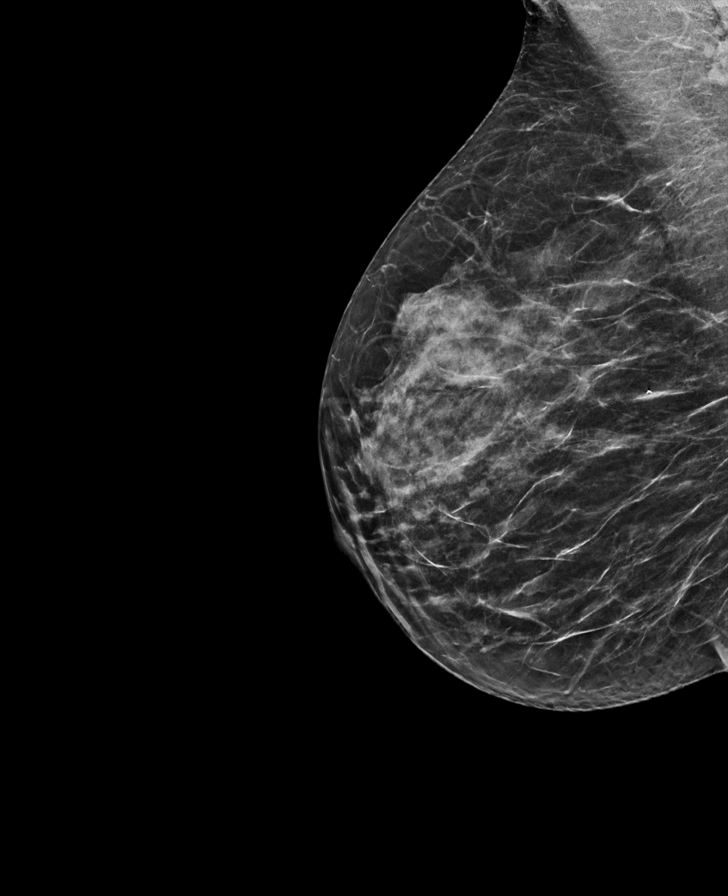

[R CC tomo · tomo slice 27/52.0]
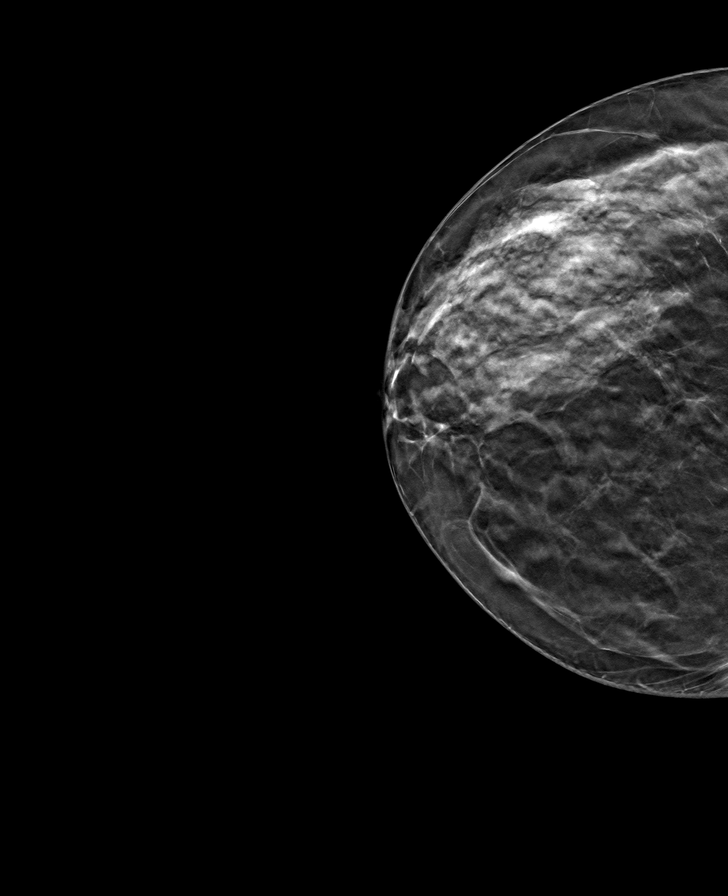

[L CC tomo · tomo slice 27/54.0]
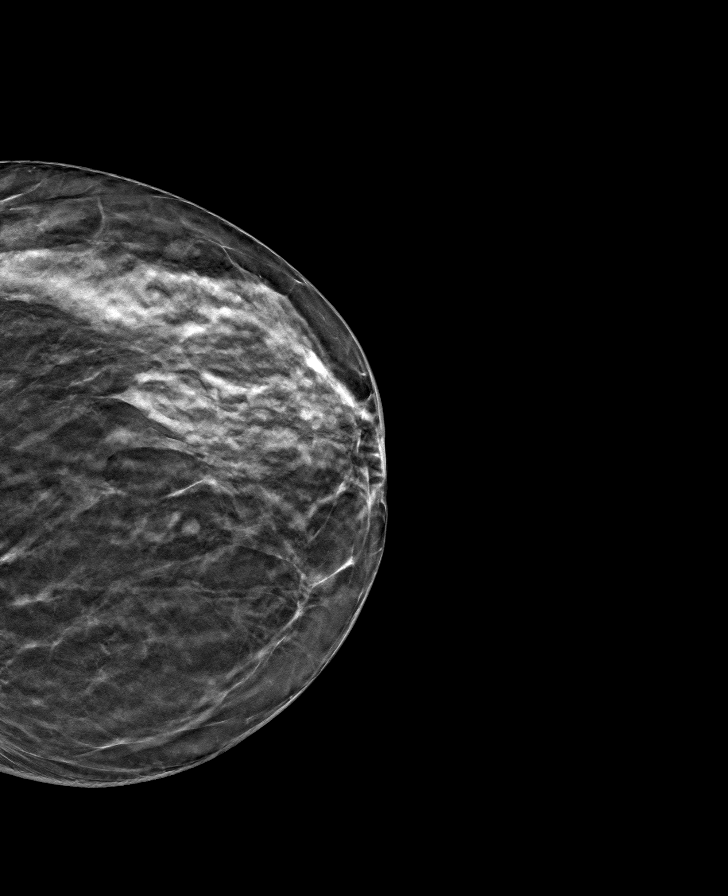

[R MLO tomo · tomo slice 27/54.0]
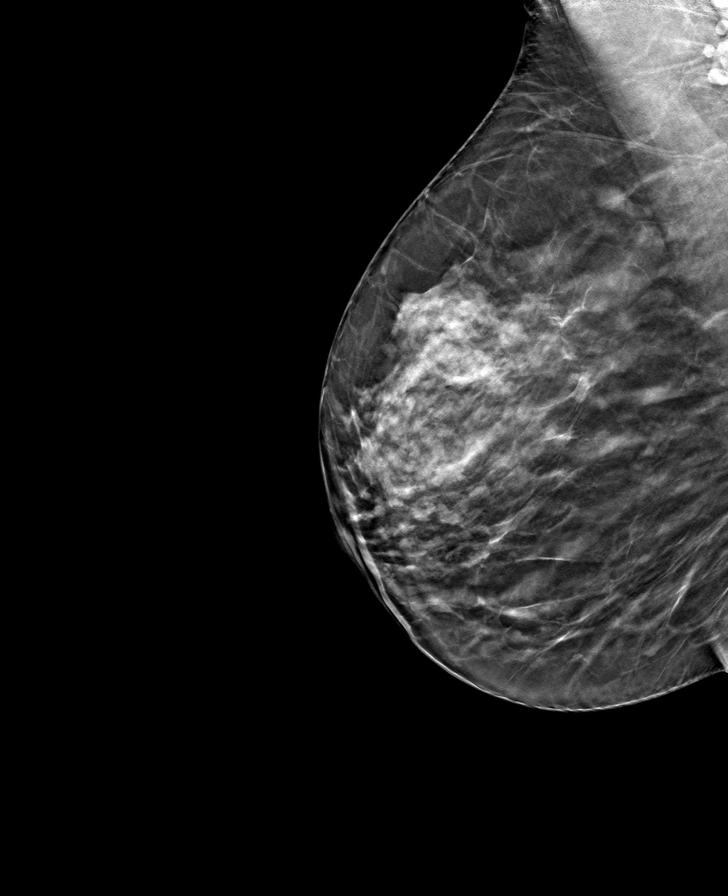

[L MLO tomo · tomo slice 29/56.0]
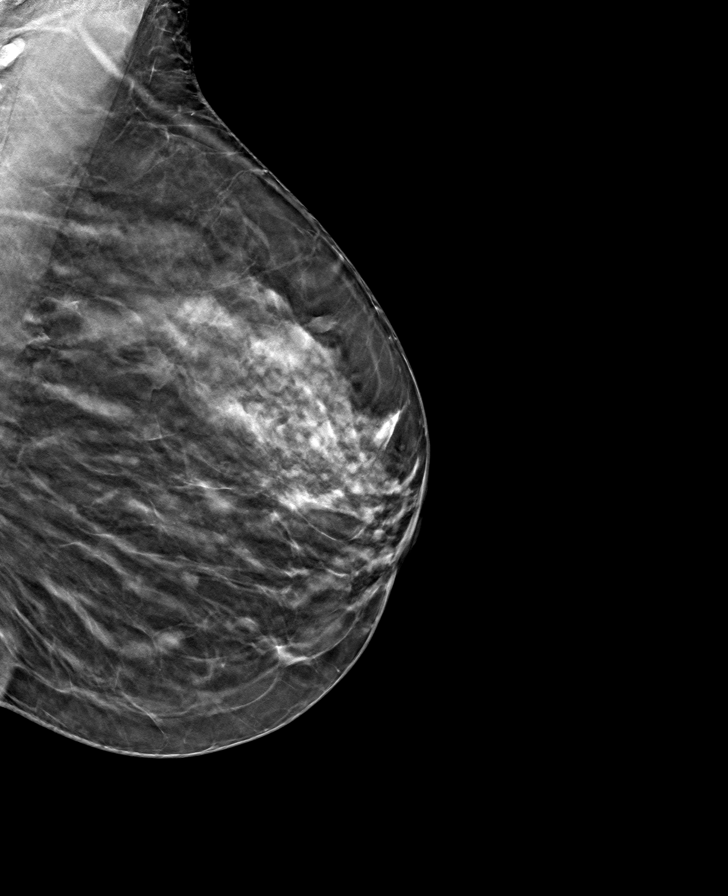

[8 of 24 positions shown; findings below may reference images not displayed]

ACR Breast Density Category c: The breast tissue is heterogeneously
dense, which may obscure small masses.
FINDINGS: There are no findings suspicious for malignancy. Images were
processed with CAD.
IMPRESSION: No mammographic evidence of malignancy. A result letter of this
screening mammogram will be mailed directly to the patient.

RECOMMENDATION:
Screening mammogram in one year. (Code:FT-U-LHB)

BI-RADS CATEGORY  1: Negative.

## 2021-04-16 NOTE — Telephone Encounter (Signed)
Called patient to give follow-up appointment information with Internal Medicine. Patient is scheduled for 04/23/21 @ 11:00 am. Will arrange transportation for patient.

## 2021-04-23 ENCOUNTER — Other Ambulatory Visit: Payer: Self-pay

## 2021-04-23 ENCOUNTER — Encounter: Payer: Self-pay | Admitting: Internal Medicine

## 2021-04-23 ENCOUNTER — Ambulatory Visit (INDEPENDENT_AMBULATORY_CARE_PROVIDER_SITE_OTHER): Payer: Self-pay | Admitting: Internal Medicine

## 2021-04-23 DIAGNOSIS — I10 Essential (primary) hypertension: Secondary | ICD-10-CM

## 2021-04-23 NOTE — Progress Notes (Signed)
CC: Elevated BP  HPI:  [name] is a 45 y.o. female with a past medical history stated below and presents today for elevated BP. [name] was referred here from [facility] due to elevated BP.  On April 15, 2021 [name]'s blood pressure reading was 151/94 when checked at [facility] clinic.  [name] states that she was under stress and nervous when the blood pressure reading was checked.  [name] has a blood pressure monitor at home.  She brought her blood pressure monitor with her for me to review.  She has checked her blood pressure regularly for the past week.  Her systolic pressure ranges from 283-151 and diastolic numbers ranges from 75-80.  Previous clinic blood pressure readings in the past year have been within normal limits.  She denies any headaches, changes in vision, lightheadedness/dizziness, chest pains, or shortness of breath.   Please see problem based assessment and plan for additional details.  Past Medical History:  Diagnosis Date   Arthritis    FMF (familial Mediterranean fever) (HCC)     Current Outpatient Medications on File Prior to Visit  Medication Sig Dispense Refill   ibuprofen (ADVIL) 800 MG tablet Take 800 mg by mouth every 8 (eight) hours as needed.     Multiple Vitamin (MULTIVITAMIN) tablet Take 1 tablet by mouth daily.     No current facility-administered medications on file prior to visit.    Family History  Problem Relation Age of Onset   Diabetes Father    Hypertension Father    Hyperlipidemia Mother    Hypertension Mother    Hypertension Paternal Grandmother    Hypertension Brother    Breast cancer Neg Hx     Social History   Socioeconomic History   Marital status: Married    Spouse name: Not on file   Number of children: 0   Years of education: Not on file   Highest education level: Master's degree (e.g., MA, MS, MEng, MEd, MSW, MBA)  Occupational History   Not on file  Tobacco Use   Smoking status: Never   Smokeless  tobacco: Never  Vaping Use   Vaping Use: Never used  Substance and Sexual Activity   Alcohol use: Yes    Comment: socially   Drug use: No   Sexual activity: Yes    Birth control/protection: None  Other Topics Concern   Not on file  Social History Narrative   Not on file   Social Determinants of Health   Financial Resource Strain: Not on file  Food Insecurity: No Food Insecurity   Worried About Programme researcher, broadcasting/film/video in the Last Year: Never true   Ran Out of Food in the Last Year: Never true  Transportation Needs: Unmet Transportation Needs   Lack of Transportation (Medical): Yes   Lack of Transportation (Non-Medical): Yes  Physical Activity: Not on file  Stress: Not on file  Social Connections: Not on file  Intimate Partner Violence: Not on file    Review of Systems  Constitutional:  Negative for chills and fever.  Eyes:  Negative for blurred vision and double vision.  Respiratory:  Negative for shortness of breath and wheezing.   Cardiovascular:  Negative for chest pain, palpitations, orthopnea and leg swelling.  Gastrointestinal:  Negative for abdominal pain, constipation, diarrhea, heartburn, nausea and vomiting.  Genitourinary:  Negative for dysuria, frequency and urgency.  Musculoskeletal:  Negative for falls and myalgias.  Neurological:  Negative for dizziness, weakness and headaches.  Vitals:   04/23/21 1051 04/23/21 1058  BP: (!) 149/85 131/90  Pulse: 70 67  Temp: 97.8 F (36.6 C)   TempSrc: Oral   SpO2: 100%   Weight: 121 lb 1.6 oz (54.9 kg)   Height: 5\' 4"  (1.626 m)      Physical Exam Constitutional:      Appearance: Normal appearance.  HENT:     Head: Normocephalic and atraumatic.  Eyes:     General: Lids are normal.  Cardiovascular:     Rate and Rhythm: Normal rate and regular rhythm.     Heart sounds: Normal heart sounds, S1 normal and S2 normal.  Pulmonary:     Effort: Pulmonary effort is normal.     Breath sounds: Normal breath sounds and  air entry.  Abdominal:     Palpations: Abdomen is soft.  Skin:    General: Skin is warm and dry.  Neurological:     General: No focal deficit present.     Mental Status: She is alert.  Psychiatric:        Attention and Perception: Attention and perception normal.        Behavior: Behavior is cooperative.      Assessment & Plan:   See Encounters Tab for problem based charting.  Patient seen with Dr. , M.D. [facility] Internal Medicine, PGY-1 Pager: [phone], Phone: [phone] Date 04/23/2021 Time 11:22 AM

## 2021-04-23 NOTE — Assessment & Plan Note (Signed)
Alice Brewer had 1 elevated blood pressure reading at [facility] on 04/15/2021-151/94.  She was referred here to evaluate blood pressure. Today her blood pressure is 131/90.  Alice Brewer checks her blood pressure daily and for the past week her blood pressure readings have been within normal limits.  Systolic range 100-1 20 and diastolic range 75-80.  She has never been on any blood pressure medication.   PLAN: Nothing to follow Blood pressure well controlled with diet and exercise.

## 2021-05-10 NOTE — Progress Notes (Signed)
Internal Medicine Clinic Attending  I saw and evaluated the patient.  I personally confirmed the key portions of the history and exam documented by Dr. Ariwodo and I reviewed pertinent patient test results.  The assessment, diagnosis, and plan were formulated together and I agree with the documentation in the resident's note.   

## 2021-06-02 ENCOUNTER — Other Ambulatory Visit: Payer: Self-pay | Admitting: Obstetrics and Gynecology

## 2021-06-02 DIAGNOSIS — Z1231 Encounter for screening mammogram for malignant neoplasm of breast: Secondary | ICD-10-CM

## 2021-07-16 ENCOUNTER — Ambulatory Visit: Payer: No Typology Code available for payment source

## 2021-07-23 ENCOUNTER — Other Ambulatory Visit: Payer: Self-pay

## 2021-07-23 ENCOUNTER — Ambulatory Visit: Payer: Self-pay | Admitting: *Deleted

## 2021-07-23 ENCOUNTER — Ambulatory Visit
Admission: RE | Admit: 2021-07-23 | Discharge: 2021-07-23 | Disposition: A | Discharge status: Home / Self Care | Payer: No Typology Code available for payment source | Source: Ambulatory Visit | Attending: Obstetrics and Gynecology | Admitting: Obstetrics and Gynecology

## 2021-07-23 VITALS — BP 136/82 | Wt 124.2 lb

## 2021-07-23 DIAGNOSIS — Z1211 Encounter for screening for malignant neoplasm of colon: Secondary | ICD-10-CM

## 2021-07-23 DIAGNOSIS — Z1239 Encounter for other screening for malignant neoplasm of breast: Secondary | ICD-10-CM

## 2021-07-23 NOTE — Patient Instructions (Signed)
Explained breast self awareness with [name]. Patient did not need a Pap smear today due to last Pap smear and HPV typing was 02/07/2018. Let her know [facility] will cover Pap smears and HPV typing every 5 years unless has a history of abnormal Pap smears. Referred patient to the [facility] for a screening mammogram on mobile unit. Appointment scheduled Thursday, July 23, 2021 at 0930. Patient escorted to the mobile unit following B[facility]CP appointment for her screening mammogram. Let patient know the Breast Center will follow up with her within the next couple weeks with results of her mammogram by letter or phone. Kachina Alber verbalized understanding.  Ailana Cuadrado, Kathaleen Maser, RN 9:13 AM

## 2021-07-23 NOTE — Progress Notes (Signed)
Alice Brewer is a 45 y.o. female who presents to [facility] clinic today with no complaints.    Pap Smear: Pap smear not completed today. Last Pap smear was 02/07/2018 at [facility] and normal with negative HPV. Per patient has no history of an abnormal Pap smear. Last Pap smear result is in Epic.   Physical exam: Breasts Breasts symmetrical. No skin abnormalities bilateral breasts. No nipple retraction bilateral breasts. No nipple discharge bilateral breasts. No lymphadenopathy. No lumps palpated bilateral breasts. Patient complained of mild left lower breast tenderness on exam that per patient is the same as last year.  MS DIGITAL SCREENING BILATERAL  Result Date: 12/03/2016 CLINICAL DATA:  Screening. EXAM: DIGITAL SCREENING BILATERAL MAMMOGRAM WITH CAD COMPARISON:  Previous exam(s). ACR Breast Density Category c: The breast tissue is heterogeneously dense, which may obscure small masses. FINDINGS: There are no findings suspicious for malignancy. Images were processed with CAD. IMPRESSION: No mammographic evidence of malignancy. A result letter of this screening mammogram will be mailed directly to the patient. RECOMMENDATION: Screening mammogram in one year. (Code:SM-B-01Y) BI-RADS CATEGORY  1: Negative. Electronically Signed   By: Amie Portland M.D.   On: 12/03/2016 14:24   MS DIGITAL SCREENING TOMO BILATERAL  Result Date: 07/14/2020 CLINICAL DATA:  Screening. EXAM: DIGITAL SCREENING BILATERAL MAMMOGRAM WITH TOMO AND CAD COMPARISON:  Previous exam(s). ACR Breast Density Category c: The breast tissue is heterogeneously dense, which may obscure small masses. FINDINGS: There are no findings suspicious for malignancy. Images were processed with CAD. IMPRESSION: No mammographic evidence of malignancy. A result letter of this screening mammogram will be mailed directly to the patient. RECOMMENDATION: Screening mammogram in one year. (Code:SM-B-01Y) BI-RADS CATEGORY  1: Negative. Electronically Signed    By: Edwin Cap M.D.   On: 07/14/2020 12:18   MS DIGITAL SCREENING TOMO BILATERAL  Result Date: 06/12/2019 CLINICAL DATA:  Screening. EXAM: DIGITAL SCREENING BILATERAL MAMMOGRAM WITH TOMO AND CAD COMPARISON:  Previous exam(s). ACR Breast Density Category c: The breast tissue is heterogeneously dense, which may obscure small masses. FINDINGS: There are no findings suspicious for malignancy. Images were processed with CAD. IMPRESSION: No mammographic evidence of malignancy. A result letter of this screening mammogram will be mailed directly to the patient. RECOMMENDATION: Screening mammogram in one year. (Code:SM-B-01Y) BI-RADS CATEGORY  1: Negative. Electronically Signed   By: Frederico Hamman M.D.   On: 06/12/2019 16:22   MS DIGITAL SCREENING TOMO BILATERAL  Result Date: 02/07/2018 CLINICAL DATA:  Screening. EXAM: DIGITAL SCREENING BILATERAL MAMMOGRAM WITH TOMO AND CAD COMPARISON:  Previous exam(s). ACR Breast Density Category c: The breast tissue is heterogeneously dense, which may obscure small masses. FINDINGS: There are no findings suspicious for malignancy. Images were processed with CAD. IMPRESSION: No mammographic evidence of malignancy. A result letter of this screening mammogram will be mailed directly to the patient. RECOMMENDATION: Screening mammogram in one year. (Code:SM-B-01Y) BI-RADS CATEGORY  1: Negative. Electronically Signed   By: Baird Lyons M.D.   On: 02/07/2018 11:16     Pelvic/Bimanual Pap is not indicated today per BCCCP guidelines.   Smoking History: Patient has never smoked.   Patient Navigation: Patient education provided. Access to services provided for patient through B[facility]CP program.   Colorectal Cancer Screening: Per patient has never had colonoscopy completed. FIT Test given to patient to complete. No complaints today.    Breast and Cervical Cancer Risk Assessment: Patient does not have family history of breast cancer, known genetic mutations, or radiation  treatment to the  chest before age 73. Patient does not have history of cervical dysplasia, immunocompromised, or DES exposure in-utero.  Risk Assessment     Risk Scores       07/23/2021 07/10/2020   Last edited by: Alice Rutherford, LPN Alice Brewer, Alice Grippe, RN   5-year risk: 1 % 0.9 %   Lifetime risk: 10.3 % 10.5 %            A: B[facility]CP exam without pap smear No complaints.  P: Referred patient to the [facility] for a screening mammogram on mobile unit. Appointment scheduled Thursday, July 23, 2021 at 0930.  Alice Heidelberg, RN 07/23/2021 9:13 AM

## 2021-08-26 ENCOUNTER — Ambulatory Visit: Payer: No Typology Code available for payment source

## 2021-09-07 ENCOUNTER — Ambulatory Visit: Payer: No Typology Code available for payment source

## 2021-09-07 ENCOUNTER — Other Ambulatory Visit: Payer: No Typology Code available for payment source

## 2021-10-12 ENCOUNTER — Inpatient Hospital Stay: Payer: Self-pay | Attending: Obstetrics and Gynecology | Admitting: *Deleted

## 2021-10-12 ENCOUNTER — Other Ambulatory Visit: Payer: Self-pay

## 2021-10-12 VITALS — BP 158/92 | Ht 63.75 in | Wt 125.2 lb

## 2021-10-12 DIAGNOSIS — Z0000 Encounter for general adult medical examination without abnormal findings: Secondary | ICD-10-CM

## 2021-10-12 NOTE — Progress Notes (Addendum)
[facility] initial screening    Clinical Measurement:  Vitals:   10/12/21 0914  BP: (!) 143/91   Fasting Labs Drawn Today, will review with patient when they result.   Medical History:  Patient states that she does not have high cholesterol, does not have high blood pressure and she does not have diabetes.  Medications:  Patient states that she does not take medication to lower cholesterol, blood pressure or blood sugar.  Patient does not take an aspirin a day to help prevent a heart attack or stroke.    Blood pressure, self measurement: Patient states that she does measure blood pressure from home. She checks her blood pressure daily. She shares her readings with a health care provider: yes.   Nutrition: Patient states that on average she eats 3 cups of fruit and 1 cups of vegetables per day. Patient states that she does not eat fish at least 2 times per week. Patient eats less than half servings of whole grains. Patient drinks less than 36 ounces of beverages with added sugar weekly: yes. Patient is currently watching sodium or salt intake: yes. In the past 7 days patient has consumed drinks containing alcohol on 1 days. On a day that patient consumes drinks containing alcohol on average 3 drinks are consumed.      Physical activity:  Patient states that she gets 60 minutes of moderate and 0 minutes of vigorous physical activity each week.  Smoking status:  Patient states that she has has never smoked .   Quality of life:  Over the past 2 weeks patient states that she had little interest or pleasure in doing things: not at all. She has been feeling down, depressed or hopeless:not at all.    Risk reduction and counseling:   Health Coaching: Spoke with patient about continuing to practice a heart healthy diet. Continue to try and add vegetables into daily diet. Patient does not currently eat a wide variety of whole grains. Gave suggestions for other whole grains that she can try and add into  diet. Encouraged patient to continue watching salt intake since blood pressure has been elevated.    Goal: Patient would like to increase exercise to 3 times a week for 30 minutes. Patient will work on reaching this goal over the next 3 months.    Navigation:  I will notify patient of lab results.  Patient is aware of 2 more health coaching sessions and a follow up. Will call patient with follow-up appointment information for Internal Medicine once appointment is scheduled for elevated BP. Provided patient with transportation to and from appointment.   Time: 20 minutes

## 2021-10-13 LAB — LIPID PANEL
Chol/HDL Ratio: 2.9 ratio (ref 0.0–4.4)
Cholesterol, Total: 179 mg/dL (ref 100–199)
HDL: 62 mg/dL (ref >39)
LDL Chol Calc (NIH): 97 mg/dL (ref 0–99)
Triglycerides: 113 mg/dL (ref 0–149)
VLDL Cholesterol Cal: 20 mg/dL (ref 5–40)

## 2021-10-13 LAB — HEMOGLOBIN A1C
Est. average glucose Bld gHb Est-mCnc: 105 mg/dL
Hgb A1c MFr Bld: 5.3 % (ref 4.8–5.6)

## 2021-10-13 LAB — GLUCOSE, RANDOM: Glucose: 100 mg/dL — ABNORMAL HIGH (ref 70–99)

## 2021-10-15 ENCOUNTER — Telehealth: Payer: Self-pay

## 2021-10-15 NOTE — Telephone Encounter (Signed)
Health coaching 2   Labs- 179 cholesterol, 62 LDL cholesterol, 113 triglycerides, 62 HDL cholesterol, 5.3 hemoglobin A1C, 100 mean plasma glucose. Patient understands and is aware of her lab results.   Goals-  1. Reduce the amount of carbs consumed. Patient informed me that she will work on cutting back on the amount of bread that she consumes. Patient states that she does not consume a lot of sweets or sugars in food or drink.  Encouraged patient to continue working towards exercise goal that patient set for herself.   Navigation:  Patient is aware of 1 more health coaching sessions and a follow up. Patient is scheduled for follow-up with Internal Medicine on Friday, October 16, 2021 @ 9:15 am. Arranged transportation for patient to and from follow-up appointment.  Time- 10 minutes

## 2021-10-16 ENCOUNTER — Ambulatory Visit (INDEPENDENT_AMBULATORY_CARE_PROVIDER_SITE_OTHER): Payer: Self-pay | Admitting: Internal Medicine

## 2021-10-16 VITALS — BP 151/91 | HR 79 | Temp 98.1°F | Wt 125.8 lb

## 2021-10-16 DIAGNOSIS — I10 Essential (primary) hypertension: Secondary | ICD-10-CM

## 2021-10-16 DIAGNOSIS — R030 Elevated blood-pressure reading, without diagnosis of hypertension: Secondary | ICD-10-CM

## 2021-10-16 DIAGNOSIS — M069 Rheumatoid arthritis, unspecified: Secondary | ICD-10-CM

## 2021-10-16 NOTE — Assessment & Plan Note (Signed)
Patient reports history of rheumatoid arthritis for which she is currently not on any treatment for her.  She does not have a rheumatologist in town but is also not interested in treatments.  She does not have flares often but when her pain is severe she does take ibuprofen 800 mg every 8 hours as needed.  Recommended that she still get established with rheumatologist and we can start with blood work here.  Patient is currently not interested.  Discussed also obtaining insurance to help cover these cost in the future.  Patient states that she will consider this as well.

## 2021-10-16 NOTE — Patient Instructions (Signed)
It was a pleasure meeting you today!  Thank you for bringing in your BP cuff today. I am glad to see your home blood pressure readings are excellent. I would recommend just checking them 2-3x a week and we can follow up in 3-6 months to make sure you are still doing well!  Thanks for allowing [place] to be a part of your care!

## 2021-10-16 NOTE — Assessment & Plan Note (Addendum)
Vitals:   10/16/21 0920  BP: (!) 151/91   Patient has an elevated BP reading.  Per chart review she does have multiple elevated blood pressure readings at previous visits as well.  She did bring in her home blood pressure cuff today which was about 165/97 here in clinic.  Other home readings were all in the low 100s for systolic with 70s to 80s diastolic.  Highest rate was 120 systolic.  She denies any headaches, chest pain, shortness of breath, or changes in her vision.  She reports a history of rheumatoid arthritis for which she occasionally takes ibuprofen 800 mg every 8 hours for severe pain.  Discussed that this can also cause an increase in blood pressure.  She states that she does not have a rheumatologist but she is also not interested in any other treatments for her disease. On exam no joint abnormalities noted.  Assessment/plan: I suspect that she may have a component of whitecoat hypertension.  Reassured that her blood pressure cuff was comparable to an office cuff and her home readings were all well controlled.  Recommended patient check her blood pressure couple times a week and follow-up in about 3 months.

## 2021-10-16 NOTE — Progress Notes (Signed)
° °  CC: elevated BP  HPI:  [name] is a 46 y.o. with PMHx listed   Past Medical History:  Diagnosis Date   Arthritis    FMF (familial Mediterranean fever) (HCC)    Review of Systems:   Review of Systems  Constitutional:  Negative for chills and fever.  Eyes:  Negative for blurred vision.  Respiratory:  Negative for shortness of breath.   Cardiovascular:  Negative for chest pain, palpitations and leg swelling.  Neurological:  Negative for dizziness and headaches.    Physical Exam:  Vitals:   10/16/21 0920  BP: (!) 151/91  Pulse: 79  Temp: 98.1 F (36.7 C)  TempSrc: Oral  SpO2: 100%  Weight: 125 lb 12.8 oz (57.1 kg)   Physical Exam General: alert, appears stated age, in no acute distress HEENT: Normocephalic, atraumatic, EOM intact, conjunctiva normal CV: Regular rate and rhythm, no murmurs rubs or gallops Pulm: Clear to auscultation bilaterally, normal work of breathing Abdomen: Soft, nondistended, bowel sounds present, no tenderness to palpation MSK: No lower extremity edema Skin: Warm and dry Neuro: Alert and oriented x3   Assessment & Plan:   See Encounters Tab for problem based charting.  Patient discussed with Dr. Antony Contras

## 2021-10-19 NOTE — Progress Notes (Signed)
Internal Medicine Clinic Attending ° °Case discussed with Dr. Rehman  At the time of the visit.  We reviewed the resident’s history and exam and pertinent patient test results.  I agree with the assessment, diagnosis, and plan of care documented in the resident’s note.  ° °

## 2022-06-10 ENCOUNTER — Other Ambulatory Visit: Payer: Self-pay

## 2022-06-10 ENCOUNTER — Telehealth: Payer: Self-pay

## 2022-06-10 DIAGNOSIS — Z1231 Encounter for screening mammogram for malignant neoplasm of breast: Secondary | ICD-10-CM

## 2022-06-10 NOTE — Telephone Encounter (Signed)
Health Coaching 3   Goals- Patient has been working on diet and exercise. Patient recently lost her cat and has been going through the grief process over the past few months. Patient is starting to feel better now and will work on taking care of her self and her own health now.   New goal-   Barrier to reaching goal-    Strategies to overcome-    Navigation:  Patient is aware of  a follow up session. Patient is now insured. Patient will FU with PCP in the future.   Time-  10 minutes

## 2022-07-05 ENCOUNTER — Ambulatory Visit
Admission: RE | Admit: 2022-07-05 | Discharge: 2022-07-05 | Disposition: A | Discharge status: Home / Self Care | Payer: Commercial Managed Care - HMO | Source: Ambulatory Visit | Attending: Internal Medicine | Admitting: Internal Medicine

## 2022-07-05 DIAGNOSIS — Z1231 Encounter for screening mammogram for malignant neoplasm of breast: Secondary | ICD-10-CM

## 2022-07-26 ENCOUNTER — Encounter: Payer: Self-pay | Admitting: Medical

## 2022-07-26 ENCOUNTER — Ambulatory Visit (INDEPENDENT_AMBULATORY_CARE_PROVIDER_SITE_OTHER): Payer: Commercial Managed Care - HMO | Admitting: Medical

## 2022-07-26 VITALS — BP 124/82 | HR 69 | Ht 63.0 in | Wt 133.0 lb

## 2022-07-26 DIAGNOSIS — Z1211 Encounter for screening for malignant neoplasm of colon: Secondary | ICD-10-CM | POA: Insufficient documentation

## 2022-07-26 DIAGNOSIS — Z13220 Encounter for screening for lipoid disorders: Secondary | ICD-10-CM

## 2022-07-26 DIAGNOSIS — M2550 Pain in unspecified joint: Secondary | ICD-10-CM

## 2022-07-26 DIAGNOSIS — Z23 Encounter for immunization: Secondary | ICD-10-CM

## 2022-07-26 DIAGNOSIS — Z832 Family history of diseases of the blood and blood-forming organs and certain disorders involving the immune mechanism: Secondary | ICD-10-CM | POA: Insufficient documentation

## 2022-07-26 DIAGNOSIS — Z0000 Encounter for general adult medical examination without abnormal findings: Secondary | ICD-10-CM | POA: Diagnosis not present

## 2022-07-26 DIAGNOSIS — Z124 Encounter for screening for malignant neoplasm of cervix: Secondary | ICD-10-CM

## 2022-07-26 DIAGNOSIS — M2560 Stiffness of unspecified joint, not elsewhere classified: Secondary | ICD-10-CM

## 2022-07-26 NOTE — Addendum Note (Signed)
Addended by: Minette Headland A on: 07/26/2022 03:28 PM   Modules accepted: Orders

## 2022-07-26 NOTE — Patient Instructions (Signed)
This visit was a preventative care visit, also known as wellness visit or routine physical.   Topics typically include healthy lifestyle, diet, exercise, preventative care, vaccinations, sick and well care, proper use of emergency dept and after hours care, as well as other concerns.     Recommendations: Continue to return yearly for your annual wellness and preventative care visits.  This gives [place] a chance to discuss healthy lifestyle, exercise, vaccinations, review your chart record, and perform screenings where appropriate.  I recommend you see your eye doctor yearly for routine vision care.  I recommend you see your dentist yearly for routine dental care including hygiene visits twice yearly.   Vaccination recommendations were reviewed Counseled on the influenza virus vaccine.  Vaccine information sheet given.  Influenza vaccine given after consent obtained.  Counseled on the Tdap (tetanus, diptheria, and acellular pertussis) vaccine.  Vaccine information sheet given. Tdap vaccine given after consent obtained.    Screening for cancer: Colon cancer screening: We will refer you for Cologard   Breast cancer screening: You should perform a self breast exam monthly.   We reviewed recommendations for regular mammograms and breast cancer screening.  Cervical cancer screening: We reviewed recommendations for pap smear screening.   Skin cancer screening: Check your skin regularly for new changes, growing lesions, or other lesions of concern Come in for evaluation if you have skin lesions of concern.  Lung cancer screening: If you have a greater than 20 pack year history of tobacco use, then you may qualify for lung cancer screening with a chest CT scan.   Please call your insurance company to inquire about coverage for this test.  We currently don't have screenings for other cancers besides breast, cervical, colon, and lung cancers.  If you have a strong family history of cancer or  have other cancer screening concerns, please let me know.    Bone health: Get at least 150 minutes of aerobic exercise weekly Get weight bearing exercise at least once weekly Bone density test:  A bone density test is an imaging test that uses a type of X-ray to measure the amount of calcium and other minerals in your bones. The test may be used to diagnose or screen you for a condition that causes weak or thin bones (osteoporosis), predict your risk for a broken bone (fracture), or determine how well your osteoporosis treatment is working. The bone density test is recommended for females 97 and older, or females or males <50 if certain risk factors such as thyroid disease, long term use of steroids such as for asthma or rheumatological issues, vitamin D deficiency, estrogen deficiency, family history of osteoporosis, self or family history of fragility fracture in first degree relative.    Heart health: Get at least 150 minutes of aerobic exercise weekly Limit alcohol It is important to maintain a healthy blood pressure and healthy cholesterol numbers  Heart disease screening: Screening for heart disease includes screening for blood pressure, fasting lipids, glucose/diabetes screening, BMI height to weight ratio, reviewed of smoking status, physical activity, and diet.    Goals include blood pressure 120/80 or less, maintaining a healthy lipid/cholesterol profile, preventing diabetes or keeping diabetes numbers under good control, not smoking or using tobacco products, exercising most days per week or at least 150 minutes per week of exercise, and eating healthy variety of fruits and vegetables, healthy oils, and avoiding unhealthy food choices like fried food, fast food, high sugar and high cholesterol foods.    Other tests  may possibly include EKG test, CT coronary calcium score, echocardiogram, exercise treadmill stress test.    Medical care options: I recommend you continue to seek  care here first for routine care.  We try really hard to have available appointments Monday through Friday daytime hours for sick visits, acute visits, and physicals.  Urgent care should be used for after hours and weekends for significant issues that cannot wait till the next day.  The emergency department should be used for significant potentially life-threatening emergencies.  The emergency department is expensive, can often have long wait times for less significant concerns, so try to utilize primary care, urgent care, or telemedicine when possible to avoid unnecessary trips to the emergency department.  Virtual visits and telemedicine have been introduced since the pandemic started in 2020, and can be convenient ways to receive medical care.  We offer virtual appointments as well to assist you in a variety of options to seek medical care.    Separate significant issues discussed: We will check autoimmune lab panels.    Return this week for fasting labs

## 2022-07-26 NOTE — Progress Notes (Signed)
Subjective:   HPI  Alice Brewer is a 46 y.o. female who presents for Chief Complaint  Patient presents with   new pt    New pt, cpe, nonfasting, would like a referral to obgyn, would like to have allergy testing for food. Irregular periods- every 4 months- not sure if she is in pre menopausal    Patient Care Team: Zaylie Gisler, Cleda Mccreedy as PCP - General (Family Medicine) Sees dentist Sees eye doctor  Concerns: Has hx/o joint pains ,numerous joints, but no swelling. Wrists, ankles, hands, more stiffness for a few hours.  Neck pain.  Family history of autoimmune disease.    Also has family history of DVTs in paternal side.    Nonfasting today   Past Medical History:  Diagnosis Date   Arthritis    FMF (familial Mediterranean fever) (HCC)     Family History  Problem Relation Age of Onset   Diabetes Father    Hypertension Father    Hyperlipidemia Mother    Hypertension Mother    Hypertension Paternal Grandmother    Hypertension Brother    Breast cancer Neg Hx      Current Outpatient Medications:    ibuprofen (ADVIL) 800 MG tablet, Take 800 mg by mouth every 8 (eight) hours as needed., Disp: , Rfl:    Multiple Vitamin (MULTIVITAMIN) tablet, Take 1 tablet by mouth daily., Disp: , Rfl:   No Known Allergies   Reviewed their medical, surgical, family, social, medication, and allergy history and updated chart as appropriate.   Review of Systems Constitutional: -fever, -chills, -sweats, -unexpected weight change, -decreased appetite, -fatigue Allergy: -sneezing, -itching, -congestion Dermatology: -changing moles, --rash, -lumps ENT: -runny nose, -ear pain, -sore throat, -hoarseness, -sinus pain, -teeth pain, - ringing in ears, -hearing loss, -nosebleeds Cardiology: -chest pain, -palpitations, -swelling, -difficulty breathing when lying flat, -waking up short of breath Respiratory: -cough, -shortness of breath, -difficulty breathing with exercise or exertion, -wheezing,  -coughing up blood Gastroenterology: -abdominal pain, -nausea, -vomiting, -diarrhea, -constipation, -blood in stool, -changes in bowel movement, -difficulty swallowing or eating Hematology: -bleeding, -bruising  Musculoskeletal: +joint aches, -muscle aches, +joint swelling, -back pain, +neck pain, -cramping, -changes in gait Ophthalmology: denies vision changes, eye redness, itching, discharge Urology: -burning with urination, -difficulty urinating, -blood in urine, -urinary frequency, -urgency, -incontinence Neurology: -headache, -weakness, -tingling, -numbness, -memory loss, -falls, -dizziness Psychology: -depressed mood, -agitation, -sleep problems Breast/gyn: -breast tendnerss, -discharge, -lumps, -vaginal discharge,- irregular periods, -heavy periods      07/26/2022    2:29 PM 10/16/2021    9:17 AM 04/23/2021   10:57 AM 09/16/2020    8:56 AM 09/02/2020    4:18 PM  Depression screen PHQ 2/9  Decreased Interest 0 0 0 0 0  Down, Depressed, Hopeless 0 0 0 0 0  PHQ - 2 Score 0 0 0 0 0  Altered sleeping   0 0 0  Tired, decreased energy   0 0 0  Change in appetite   0 0 0  Feeling bad or failure about yourself    0 0 0  Trouble concentrating   0 0 0  Moving slowly or fidgety/restless   0 0 0  Suicidal thoughts   0 0 0  PHQ-9 Score   0 0 0  Difficult doing work/chores   Not difficult at all Not difficult at all Not difficult at all       Objective:  BP 124/82   Pulse 69   Ht 5\' 3"  (1.6 m)  Wt 133 lb (60.3 kg)   LMP 07/19/2022   BMI 23.56 kg/m   General appearance: alert, no distress, WD/WN, Caucasian female Skin: unremarkable HEENT: normocephalic, conjunctiva/corneas normal, sclerae anicteric, PERRLA, EOMi, nares patent, no discharge or erythema, pharynx normal Oral cavity: MMM, tongue normal, teeth normal Neck: supple, no lymphadenopathy, no thyromegaly, no masses, normal ROM, no bruits Chest: non tender, normal shape and expansion Heart: RRR, normal S1, S2, no  murmurs Lungs: CTA bilaterally, no wheezes, rhonchi, or rales Abdomen: +bs, soft, non tender, non distended, no masses, no hepatomegaly, no splenomegaly, no bruits Back: non tender, normal ROM, no scoliosis Musculoskeletal: no obvious joint swelling, no obvious bony abnormalities, upper extremities non tender, no obvious deformity, normal ROM throughout, lower extremities non tender, no obvious deformity, normal ROM throughout Extremities: +scattered spider veins of bilat legs, no edema, no cyanosis, no clubbing Pulses: 2+ symmetric, upper and lower extremities, normal cap refill Neurological: alert, oriented x 3, CN2-12 intact, strength normal upper extremities and lower extremities, sensation normal throughout, DTRs 2+ throughout, no cerebellar signs, gait normal Psychiatric: normal affect, behavior normal, pleasant  Breast/gyn/rectal - deferred to gynecology     Assessment and Plan :   Encounter Diagnoses  Name Primary?   Encounter for health maintenance examination in adult Yes   Need for influenza vaccination    Need for Tdap vaccination    Screen for colon cancer    Family history of autoimmune disorder    Screening for cervical cancer    Polyarthralgia    Morning stiffness of joints    Screening for lipid disorders      This visit was a preventative care visit, also known as wellness visit or routine physical.   Topics typically include healthy lifestyle, diet, exercise, preventative care, vaccinations, sick and well care, proper use of emergency dept and after hours care, as well as other concerns.     Recommendations: Continue to return yearly for your annual wellness and preventative care visits.  This gives Alice Brewer a chance to discuss healthy lifestyle, exercise, vaccinations, review your chart record, and perform screenings where appropriate.  I recommend you see your eye doctor yearly for routine vision care.  I recommend you see your dentist yearly for routine dental  care including hygiene visits twice yearly.   Vaccination recommendations were reviewed Counseled on the influenza virus vaccine.  Vaccine information sheet given.  Influenza vaccine given after consent obtained.  Counseled on the Tdap (tetanus, diptheria, and acellular pertussis) vaccine.  Vaccine information sheet given. Tdap vaccine given after consent obtained.    Screening for cancer: Colon cancer screening: We will refer you for Cologuard   Breast cancer screening: You should perform a self breast exam monthly.   We reviewed recommendations for regular mammograms and breast cancer screening.  Cervical cancer screening: We reviewed recommendations for pap smear screening.   Skin cancer screening: Check your skin regularly for new changes, growing lesions, or other lesions of concern Come in for evaluation if you have skin lesions of concern.  Lung cancer screening: If you have a greater than 20 pack year history of tobacco use, then you may qualify for lung cancer screening with a chest CT scan.   Please call your insurance company to inquire about coverage for this test.  We currently don't have screenings for other cancers besides breast, cervical, colon, and lung cancers.  If you have a strong family history of cancer or have other cancer screening concerns, please let me know.  Bone health: Get at least 150 minutes of aerobic exercise weekly Get weight bearing exercise at least once weekly Bone density test:  A bone density test is an imaging test that uses a type of X-ray to measure the amount of calcium and other minerals in your bones. The test may be used to diagnose or screen you for a condition that causes weak or thin bones (osteoporosis), predict your risk for a broken bone (fracture), or determine how well your osteoporosis treatment is working. The bone density test is recommended for females 65 and older, or females or males <65 if certain risk factors such  as thyroid disease, long term use of steroids such as for asthma or rheumatological issues, vitamin D deficiency, estrogen deficiency, family history of osteoporosis, self or family history of fragility fracture in first degree relative.    Heart health: Get at least 150 minutes of aerobic exercise weekly Limit alcohol It is important to maintain a healthy blood pressure and healthy cholesterol numbers  Heart disease screening: Screening for heart disease includes screening for blood pressure, fasting lipids, glucose/diabetes screening, BMI height to weight ratio, reviewed of smoking status, physical activity, and diet.    Goals include blood pressure 120/80 or less, maintaining a healthy lipid/cholesterol profile, preventing diabetes or keeping diabetes numbers under good control, not smoking or using tobacco products, exercising most days per week or at least 150 minutes per week of exercise, and eating healthy variety of fruits and vegetables, healthy oils, and avoiding unhealthy food choices like fried food, fast food, high sugar and high cholesterol foods.    Other tests may possibly include EKG test, CT coronary calcium score, echocardiogram, exercise treadmill stress test.    Medical care options: I recommend you continue to seek care here first for routine care.  We try really hard to have available appointments Monday through Friday daytime hours for sick visits, acute visits, and physicals.  Urgent care should be used for after hours and weekends for significant issues that cannot wait till the next day.  The emergency department should be used for significant potentially life-threatening emergencies.  The emergency department is expensive, can often have long wait times for less significant concerns, so try to utilize primary care, urgent care, or telemedicine when possible to avoid unnecessary trips to the emergency department.  Virtual visits and telemedicine have been introduced since  the pandemic started in 2020, and can be convenient ways to receive medical care.  We offer virtual appointments as well to assist you in a variety of options to seek medical care.    Separate significant issues discussed: We will check autoimmune lab panels.    Return this week for fasting labs    Alice Brewer was seen today for new pt.  Diagnoses and all orders for this visit:  Encounter for health maintenance examination in adult -     Comprehensive metabolic panel; Future -     CBC; Future -     Lipid panel; Future -     TSH; Future -     POCT Urinalysis DIP (Proadvantage Device); Future -     Cologuard -     Sedimentation Rate; Future -     Uric acid; Future -     CYCLIC CITRUL PEPTIDE ANTIBODY, IGG/IGA; Future -     ANA; Future -     Rheumatoid factor; Future -     VITAMIN D 25 Hydroxy (Vit-D Deficiency, Fractures); Future  Need for influenza vaccination  Need  for Tdap vaccination  Screen for colon cancer -     Cologuard  Family history of autoimmune disorder -     Sedimentation Rate; Future -     Uric acid; Future -     CYCLIC CITRUL PEPTIDE ANTIBODY, IGG/IGA; Future -     ANA; Future -     Rheumatoid factor; Future  Screening for cervical cancer  Polyarthralgia -     Sedimentation Rate; Future -     Uric acid; Future -     CYCLIC CITRUL PEPTIDE ANTIBODY, IGG/IGA; Future -     ANA; Future -     Rheumatoid factor; Future  Morning stiffness of joints -     Sedimentation Rate; Future -     Uric acid; Future -     CYCLIC CITRUL PEPTIDE ANTIBODY, IGG/IGA; Future -     ANA; Future -     Rheumatoid factor; Future  Screening for lipid disorders -     Lipid panel; Future    Follow-up pending labs, yearly for physical

## 2022-07-27 ENCOUNTER — Other Ambulatory Visit: Payer: Commercial Managed Care - HMO

## 2022-07-27 DIAGNOSIS — M2550 Pain in unspecified joint: Secondary | ICD-10-CM

## 2022-07-27 DIAGNOSIS — Z832 Family history of diseases of the blood and blood-forming organs and certain disorders involving the immune mechanism: Secondary | ICD-10-CM

## 2022-07-27 DIAGNOSIS — Z0000 Encounter for general adult medical examination without abnormal findings: Secondary | ICD-10-CM

## 2022-07-27 DIAGNOSIS — Z13220 Encounter for screening for lipoid disorders: Secondary | ICD-10-CM

## 2022-07-27 DIAGNOSIS — M2560 Stiffness of unspecified joint, not elsewhere classified: Secondary | ICD-10-CM

## 2022-07-27 LAB — POCT URINALYSIS DIP (PROADVANTAGE DEVICE)
Bilirubin, UA: NEGATIVE
Blood, UA: NEGATIVE
Glucose, UA: NEGATIVE mg/dL
Ketones, POC UA: NEGATIVE mg/dL
Leukocytes, UA: NEGATIVE
Nitrite, UA: NEGATIVE
Protein Ur, POC: NEGATIVE mg/dL
Specific Gravity, Urine: 1.005
Urobilinogen, Ur: NEGATIVE
pH, UA: 7 (ref 5.0–8.0)

## 2022-07-27 NOTE — Progress Notes (Signed)
fsh

## 2022-07-28 LAB — FOLLICLE STIMULATING HORMONE: FSH: 16.6 m[IU]/mL

## 2022-07-29 LAB — COMPREHENSIVE METABOLIC PANEL
ALT: 16 IU/L (ref 0–32)
AST: 14 IU/L (ref 0–40)
Albumin/Globulin Ratio: 2 (ref 1.2–2.2)
Albumin: 4.7 g/dL (ref 3.9–4.9)
Alkaline Phosphatase: 79 IU/L (ref 44–121)
BUN/Creatinine Ratio: 10 (ref 9–23)
BUN: 7 mg/dL (ref 6–24)
Bilirubin Total: 0.4 mg/dL (ref 0.0–1.2)
CO2: 23 mmol/L (ref 20–29)
Calcium: 9.7 mg/dL (ref 8.7–10.2)
Chloride: 100 mmol/L (ref 96–106)
Creatinine, Ser: 0.69 mg/dL (ref 0.57–1.00)
Globulin, Total: 2.3 g/dL (ref 1.5–4.5)
Glucose: 86 mg/dL (ref 70–99)
Potassium: 4.5 mmol/L (ref 3.5–5.2)
Sodium: 139 mmol/L (ref 134–144)
Total Protein: 7 g/dL (ref 6.0–8.5)
eGFR: 107 mL/min/{1.73_m2} (ref >59)

## 2022-07-29 LAB — VITAMIN D 25 HYDROXY (VIT D DEFICIENCY, FRACTURES): Vit D, 25-Hydroxy: 39.9 ng/mL (ref 30.0–100.0)

## 2022-07-29 LAB — URIC ACID: Uric Acid: 3.6 mg/dL (ref 2.6–6.2)

## 2022-07-29 LAB — LIPID PANEL
Chol/HDL Ratio: 2.8 ratio (ref 0.0–4.4)
Cholesterol, Total: 180 mg/dL (ref 100–199)
HDL: 64 mg/dL (ref >39)
LDL Chol Calc (NIH): 97 mg/dL (ref 0–99)
Triglycerides: 106 mg/dL (ref 0–149)
VLDL Cholesterol Cal: 19 mg/dL (ref 5–40)

## 2022-07-29 LAB — CYCLIC CITRUL PEPTIDE ANTIBODY, IGG/IGA: Cyclic Citrullin Peptide Ab: <1 units (ref 0–19)

## 2022-07-29 LAB — CBC
Hematocrit: 38.9 % (ref 34.0–46.6)
Hemoglobin: 13.1 g/dL (ref 11.1–15.9)
MCH: 30.1 pg (ref 26.6–33.0)
MCHC: 33.7 g/dL (ref 31.5–35.7)
MCV: 89 fL (ref 79–97)
Platelets: 205 10*3/uL (ref 150–450)
RBC: 4.35 x10E6/uL (ref 3.77–5.28)
RDW: 12.7 % (ref 11.7–15.4)
WBC: 7 10*3/uL (ref 3.4–10.8)

## 2022-07-29 LAB — SEDIMENTATION RATE: Sed Rate: 5 mm/hr (ref 0–32)

## 2022-07-29 LAB — TSH: TSH: 1.29 u[IU]/mL (ref 0.450–4.500)

## 2022-07-29 LAB — RHEUMATOID FACTOR: Rheumatoid fact SerPl-aCnc: <10 IU/mL (ref <14.0)

## 2022-07-29 LAB — ANA: Anti Nuclear Antibody (ANA): POSITIVE — AB

## 2022-09-01 LAB — COLOGUARD: COLOGUARD: NEGATIVE

## 2023-08-28 DIAGNOSIS — Z419 Encounter for procedure for purposes other than remedying health state, unspecified: Secondary | ICD-10-CM | POA: Diagnosis not present

## 2023-09-12 ENCOUNTER — Other Ambulatory Visit: Payer: Self-pay | Admitting: Medical

## 2023-09-12 DIAGNOSIS — Z1231 Encounter for screening mammogram for malignant neoplasm of breast: Secondary | ICD-10-CM

## 2023-09-16 ENCOUNTER — Ambulatory Visit
Admission: RE | Admit: 2023-09-16 | Discharge: 2023-09-16 | Disposition: A | Discharge status: Home / Self Care | Payer: Medicaid Other | Source: Ambulatory Visit | Attending: Medical | Admitting: Medical

## 2023-09-16 DIAGNOSIS — Z1231 Encounter for screening mammogram for malignant neoplasm of breast: Secondary | ICD-10-CM

## 2023-09-28 DIAGNOSIS — Z419 Encounter for procedure for purposes other than remedying health state, unspecified: Secondary | ICD-10-CM | POA: Diagnosis not present

## 2023-10-26 ENCOUNTER — Encounter: Payer: Self-pay | Admitting: Medical

## 2023-10-26 ENCOUNTER — Ambulatory Visit: Payer: BLUE CROSS/BLUE SHIELD | Admitting: Medical

## 2023-10-26 VITALS — BP 120/80 | HR 74 | Ht 63.0 in | Wt 136.2 lb

## 2023-10-26 DIAGNOSIS — Z23 Encounter for immunization: Secondary | ICD-10-CM

## 2023-10-26 DIAGNOSIS — R9431 Abnormal electrocardiogram [ECG] [EKG]: Secondary | ICD-10-CM

## 2023-10-26 DIAGNOSIS — Z0000 Encounter for general adult medical examination without abnormal findings: Secondary | ICD-10-CM

## 2023-10-26 DIAGNOSIS — Z13220 Encounter for screening for lipoid disorders: Secondary | ICD-10-CM

## 2023-10-26 DIAGNOSIS — E569 Vitamin deficiency, unspecified: Secondary | ICD-10-CM

## 2023-10-26 DIAGNOSIS — R030 Elevated blood-pressure reading, without diagnosis of hypertension: Secondary | ICD-10-CM

## 2023-10-26 DIAGNOSIS — Z136 Encounter for screening for cardiovascular disorders: Secondary | ICD-10-CM | POA: Diagnosis not present

## 2023-10-26 DIAGNOSIS — Z780 Asymptomatic menopausal state: Secondary | ICD-10-CM

## 2023-10-26 DIAGNOSIS — Z124 Encounter for screening for malignant neoplasm of cervix: Secondary | ICD-10-CM | POA: Diagnosis not present

## 2023-10-26 NOTE — Patient Instructions (Signed)
Throbbing in legs, thrombi this or inflammation of the veins I recommend you exercise most days per week such as walking or elliptical or riding a bike or playing a sport such as tennis I recommend you consider wearing compression hose daily You can use heat or hot bath regularly, soaking for 20 minutes If you have worse pain for a few days where your legs are very achy you can use over-the-counter aspirin 325 mg daily for 5 to 7 days at a time   Return soon for fasting labs    Menopause: What to Know Menopause is the time in your life when your menstrual periods stop. It marks the end of your ability to get pregnant. It can be defined as not having a period for 12 months without another medical cause. The time when you start to move into menopause is called perimenopause. It often happens between ages 22-55. It can last for many years. During perimenopause, hormone levels change in your body. This can cause symptoms and affect your health. Menopause may make you more likely to have: Bones that are weak and break more easily. Depression. This is when you feel sad or hopeless. Arteries that harden and get narrow. These can cause heart attacks and strokes. What are the causes? In most cases, menopause is a natural change to your body and hormone levels that happens as you get older. But in some cases, it may be caused by changes that aren't natural. These include: Surgery to take out both ovaries. Side effects from some medicines. What increases the risk? You're more likely to go through menopause early if: You have an abnormal growth (tumor) of the pituitary gland in your brain. You have a disease that affects your ovaries. You've had certain treatments for cancer. These include: Chemotherapy. Hormone therapy. Radiation therapy on the area between your hips (pelvis). You smoke a lot or drink a lot of alcohol. Other people in your family have gone through menopause early. You're very  thin. What are the signs or symptoms? You may have: Hot flashes. Irregular periods. Night sweats. Changes in how you feel about sex. You may: Have less of a sex drive. Feel more discomfort around your sexuality. Vaginal dryness and thinning of the vaginal walls. This may make it hurt to have sex. Skin changes, such as: Dry skin. New wrinkles. Headaches. Other symptoms may include: Trouble sleeping. Mood swings. Memory problems. Weight gain. Hair growth on your face and chest. Bladder infections or trouble peeing. How is this diagnosed? You may be diagnosed based on: Your medical history. An exam. Your age. Your history of menstrual periods. Your symptoms. Hormone tests. How is this treated? In some cases, no treatment is needed. Talk with your health care provider about if you should get treated. Treatments may include: Menopausal hormone therapy (MHT). Medicines to treat certain symptoms. Acupuncture. Vitamin or herbal supplements. Before you start treatment, let your provider know if you or anyone in your family has or has had: Heart disease. Breast cancer. Blood clots. Diabetes. Osteoporosis. Follow these instructions at home: Eating and drinking  Eat a balanced diet. It should include: Fresh fruits and vegetables. Whole grains. Lean protein. Low-fat dairy. Eat lots of foods that have calcium and vitamin D in them. These can help keep your bones healthy. Foods and drinks that are rich in calcium include: Yogurt and low-fat milk. Beans. Almonds. Sardines. Broccoli and kale. To help prevent hot flashes, stay away from: Alcohol. Drinks with caffeine in them. Spicy  foods. Lifestyle Do not smoke, vape, or use nicotine or tobacco. Get 7-8 hours of sleep each night. If you have hot flashes, you may want to: Dress in layers. Avoid things that may trigger hot flashes, like warm places or stress. Take slow, deep breaths when a hot flash starts. Keep a fan  in your home and office. Find ways to manage stress. You may want to try: Deep breathing. Meditation. Writing in a journal. Ask your provider about going to group therapy. Therapy can help you get support from others who are going through menopause. General instructions  Talk with your provider before you take any herbal supplements. Keep track of your symptoms. Track: When they start. How often you have them. How long they last. Use vaginal lubricants or moisturizers. These can help with: Vaginal dryness. Comfort during sex. Contact a health care provider if: You're older than 55 and still get periods. You have pain during sex. You haven't had a period for 12 months and then start to bleed from your vagina. It hurts to pee. You get very bad headaches. Get help right away if: You're very depressed. You have a lot of bleeding from your vagina. Your heart is beating too fast. You have very bad belly pain or indigestion that doesn't go away with medicines. This information is not intended to replace advice given to you by your health care provider. Make sure you discuss any questions you have with your health care provider. Document Revised: 05/19/2023 Document Reviewed: 05/19/2023 Elsevier Patient Education  2024 [employer].

## 2023-10-26 NOTE — Progress Notes (Signed)
Subjective:   HPI  Alice Brewer is a 48 y.o. female who presents for Chief Complaint  Patient presents with   Annual Exam    Nonfasting cpe, thinks she's in menopause as she hasn't has a period in a over a year,     Patient Care Team: Jovany Disano, Cleda Mccreedy as PCP - General (Family Medicine) Sees dentist Sees eye doctor  Concerns: Would like referral to gynecology for Pap smear  She notes for the last year or so that she gets throbbing achiness in her legs veins every now and then.  No injury or trauma.  She is not really exercising.  Blood pressures sometimes run little elevated.  Particular if eating salt she can see the numbers go up.  No prior blood pressure diagnosis or treatment  She thinks she is entering menopause.  She does not really have any symptoms or hot flashes but has not had a period in about a year  Nonfasting today  Past Medical History:  Diagnosis Date   Arthritis    FMF (familial Mediterranean fever) (HCC)     Family History  Problem Relation Age of Onset   Hyperlipidemia Mother    Hypertension Mother    Diabetes Father    Hypertension Father    Hypertension Brother    Hypertension Paternal Grandmother    Heart disease Paternal Grandfather    Deep vein thrombosis Paternal Grandfather    Breast cancer Neg Hx    Cancer Neg Hx      Current Outpatient Medications:    ibuprofen (ADVIL) 800 MG tablet, Take 800 mg by mouth every 8 (eight) hours as needed., Disp: , Rfl:    Multiple Vitamin (MULTIVITAMIN) tablet, Take 1 tablet by mouth daily. (Patient not taking: Reported on 10/26/2023), Disp: , Rfl:   No Known Allergies   Reviewed their medical, surgical, family, social, medication, and allergy history and updated chart as appropriate.  Review of Systems  Constitutional:  Negative for chills, fever, malaise/fatigue and weight loss.  HENT:  Negative for congestion, ear pain, hearing loss, sore throat and tinnitus.   Eyes:  Negative for blurred  vision, pain and redness.  Respiratory:  Negative for cough, hemoptysis and shortness of breath.   Cardiovascular:  Negative for chest pain, palpitations, orthopnea, claudication and leg swelling.  Gastrointestinal:  Negative for abdominal pain, blood in stool, constipation, diarrhea, nausea and vomiting.  Genitourinary:  Negative for dysuria, flank pain, frequency, hematuria and urgency.  Musculoskeletal:  Negative for falls, joint pain and myalgias.       Leg aches  Skin:  Negative for itching and rash.  Neurological:  Negative for dizziness, tingling, speech change, weakness and headaches.  Endo/Heme/Allergies:  Negative for polydipsia. Does not bruise/bleed easily.  Psychiatric/Behavioral:  Negative for depression and memory loss. The patient is not nervous/anxious and does not have insomnia.          10/26/2023    3:04 PM 07/26/2022    2:29 PM 10/16/2021    9:17 AM 04/23/2021   10:57 AM 09/16/2020    8:56 AM  Depression screen PHQ 2/9  Decreased Interest 0 0 0 0 0  Down, Depressed, Hopeless 0 0 0 0 0  PHQ - 2 Score 0 0 0 0 0  Altered sleeping    0 0  Tired, decreased energy    0 0  Change in appetite    0 0  Feeling bad or failure about yourself     0 0  Trouble concentrating    0 0  Moving slowly or fidgety/restless    0 0  Suicidal thoughts    0 0  PHQ-9 Score    0 0  Difficult doing work/chores    Not difficult at all Not difficult at all       Objective:  BP 120/80   Pulse 74   Ht 5\' 3"  (1.6 m)   Wt 136 lb 3.2 oz (61.8 kg)   SpO2 98%   BMI 24.13 kg/m   General appearance: alert, no distress, WD/WN, Caucasian female Skin: unremarkable HEENT: normocephalic, conjunctiva/corneas normal, sclerae anicteric, PERRLA, EOMi, nares patent, no discharge or erythema, pharynx normal Oral cavity: MMM, tongue normal, teeth normal Neck: supple, no lymphadenopathy, no thyromegaly, no masses, normal ROM, no bruits Chest: non tender, normal shape and expansion Heart: RRR,  normal S1, S2, no murmurs Lungs: CTA bilaterally, no wheezes, rhonchi, or rales Abdomen: +bs, soft, non tender, non distended, no masses, no hepatomegaly, no splenomegaly, no bruits Back: non tender, normal ROM, no scoliosis Musculoskeletal: no obvious joint swelling, no obvious bony abnormalities, upper extremities non tender, no obvious deformity, normal ROM throughout, lower extremities non tender, no obvious deformity, normal ROM throughout Extremities: +scattered spider veins of bilat legs, no edema, no cyanosis, no clubbing Pulses: 2+ symmetric, upper and lower extremities, normal cap refill Neurological: alert, oriented x 3, CN2-12 intact, strength normal upper extremities and lower extremities, sensation normal throughout, DTRs 2+ throughout, no cerebellar signs, gait normal Psychiatric: normal affect, behavior normal, pleasant  Breast/gyn/rectal - deferred to gynecology   EKG reviewed  Assessment and Plan :   Encounter Diagnoses  Name Primary?   Routine general medical examination at a health care facility Yes   Pap smear for cervical cancer screening    Needs flu shot    Need for COVID-19 vaccine    Vitamin deficiency    Encounter for lipid screening for cardiovascular disease    Menopause    Screening for heart disease    Abnormal EKG      This visit was a preventative care visit, also known as wellness visit or routine physical.   Topics typically include healthy lifestyle, diet, exercise, preventative care, vaccinations, sick and well care, proper use of emergency dept and after hours care, as well as other concerns.     Recommendations: Continue to return yearly for your annual wellness and preventative care visits.  This gives [place] a chance to discuss healthy lifestyle, exercise, vaccinations, review your chart record, and perform screenings where appropriate.  I recommend you see your eye doctor yearly for routine vision care.  I recommend you see your dentist  yearly for routine dental care including hygiene visits twice yearly.   Vaccination recommendations were reviewed Counseled on the influenza virus vaccine.  Vaccine information sheet given.  Influenza vaccine given after consent obtained.  Counseled on the Covid virus vaccine.  Vaccine information sheet given.  Covid vaccine given after consent obtained.   Screening for cancer: Colon cancer screening: Cologuard up-to-date  Breast cancer screening: You should perform a self breast exam monthly.   We reviewed recommendations for regular mammograms and breast cancer screening.  Cervical cancer screening: We reviewed recommendations for pap smear screening.   Skin cancer screening: Check your skin regularly for new changes, growing lesions, or other lesions of concern Come in for evaluation if you have skin lesions of concern.  Lung cancer screening: If you have a greater than 20 pack year history of  tobacco use, then you may qualify for lung cancer screening with a chest CT scan.   Please call your insurance company to inquire about coverage for this test.  We currently don't have screenings for other cancers besides breast, cervical, colon, and lung cancers.  If you have a strong family history of cancer or have other cancer screening concerns, please let me know.    Bone health: Get at least 150 minutes of aerobic exercise weekly Get weight bearing exercise at least once weekly Bone density test:  A bone density test is an imaging test that uses a type of X-ray to measure the amount of calcium and other minerals in your bones. The test may be used to diagnose or screen you for a condition that causes weak or thin bones (osteoporosis), predict your risk for a broken bone (fracture), or determine how well your osteoporosis treatment is working. The bone density test is recommended for females 65 and older, or females or males <65 if certain risk factors such as thyroid disease, long  term use of steroids such as for asthma or rheumatological issues, vitamin D deficiency, estrogen deficiency, family history of osteoporosis, self or family history of fragility fracture in first degree relative.    Heart health: Get at least 150 minutes of aerobic exercise weekly Limit alcohol It is important to maintain a healthy blood pressure and healthy cholesterol numbers  Heart disease screening: Screening for heart disease includes screening for blood pressure, fasting lipids, glucose/diabetes screening, BMI height to weight ratio, reviewed of smoking status, physical activity, and diet.    Goals include blood pressure 120/80 or less, maintaining a healthy lipid/cholesterol profile, preventing diabetes or keeping diabetes numbers under good control, not smoking or using tobacco products, exercising most days per week or at least 150 minutes per week of exercise, and eating healthy variety of fruits and vegetables, healthy oils, and avoiding unhealthy food choices like fried food, fast food, high sugar and high cholesterol foods.    Other tests may possibly include EKG test, CT coronary calcium score, echocardiogram, exercise treadmill stress test.    Medical care options: I recommend you continue to seek care here first for routine care.  We try really hard to have available appointments Monday through Friday daytime hours for sick visits, acute visits, and physicals.  Urgent care should be used for after hours and weekends for significant issues that cannot wait till the next day.  The emergency department should be used for significant potentially life-threatening emergencies.  The emergency department is expensive, can often have long wait times for less significant concerns, so try to utilize primary care, urgent care, or telemedicine when possible to avoid unnecessary trips to the emergency department.  Virtual visits and telemedicine have been introduced since the pandemic started in  2020, and can be convenient ways to receive medical care.  We offer virtual appointments as well to assist you in a variety of options to seek medical care.    Separate significant issues discussed: Return this week for fasting labs  Screening labs given concern for vitamin deficiency  We discussed lack of menstrual period for about a year which would be defined as menopause.  Labs to help corroborate.  History of elevated blood pressures-advise she monitor blood pressures and get me readings in a few weeks  Screen for heart disease -discussed CT coronary test.  She will consider  Given elevated blood pressure readings I did a baseline EKG which was abnormal today.  Pending labs can offer referral to cardiology for baseline consult  Noble "eminefowler" was seen today for annual exam.  Diagnoses and all orders for this visit:  Routine general medical examination at a health care facility -     Comprehensive metabolic panel; Future -     CBC; Future -     TSH; Future -     Lipid panel; Future -     FSH/LH; Future -     Estrogens, Total; Future -     VITAMIN D 25 Hydroxy (Vit-D Deficiency, Fractures); Future -     Vitamin B12; Future -     HIV Antibody (routine testing w rflx); Future -     Hepatitis C antibody; Future  Pap smear for cervical cancer screening -     Ambulatory referral to Gynecology  Needs flu shot -     Flu vaccine trivalent PF, 6mos and older(Flulaval,Afluria,Fluarix,Fluzone)  Need for COVID-19 vaccine -     Pfizer Comirnaty Covid -19 Vaccine 53yrs and older  Vitamin deficiency -     VITAMIN D 25 Hydroxy (Vit-D Deficiency, Fractures); Future -     Vitamin B12; Future  Encounter for lipid screening for cardiovascular disease -     Lipid panel; Future  Menopause -     FSH/LH; Future -     Estrogens, Total; Future  Screening for heart disease  Abnormal EKG    Follow-up pending labs, yearly for physical

## 2023-10-27 ENCOUNTER — Other Ambulatory Visit: Payer: Medicaid Other

## 2023-10-28 DIAGNOSIS — Z13220 Encounter for screening for lipoid disorders: Secondary | ICD-10-CM | POA: Diagnosis not present

## 2023-10-28 DIAGNOSIS — E569 Vitamin deficiency, unspecified: Secondary | ICD-10-CM | POA: Diagnosis not present

## 2023-10-28 DIAGNOSIS — Z136 Encounter for screening for cardiovascular disorders: Secondary | ICD-10-CM | POA: Diagnosis not present

## 2023-10-28 DIAGNOSIS — Z0000 Encounter for general adult medical examination without abnormal findings: Secondary | ICD-10-CM | POA: Diagnosis not present

## 2023-10-28 DIAGNOSIS — Z780 Asymptomatic menopausal state: Secondary | ICD-10-CM | POA: Diagnosis not present

## 2023-10-28 LAB — CBC

## 2023-10-29 DIAGNOSIS — Z419 Encounter for procedure for purposes other than remedying health state, unspecified: Secondary | ICD-10-CM | POA: Diagnosis not present

## 2023-10-29 LAB — HEPATITIS C ANTIBODY

## 2023-10-29 LAB — LIPID PANEL
Chol/HDL Ratio: 2.3 {ratio} (ref 0.0–4.4)
Cholesterol, Total: 198 mg/dL (ref 100–199)
HDL: 88 mg/dL (ref >39)
LDL Chol Calc (NIH): 97 mg/dL (ref 0–99)
Triglycerides: 74 mg/dL (ref 0–149)
VLDL Cholesterol Cal: 13 mg/dL (ref 5–40)

## 2023-10-29 LAB — COMPREHENSIVE METABOLIC PANEL
ALT: 22 [IU]/L (ref 0–32)
AST: 18 [IU]/L (ref 0–40)
Albumin: 4.6 g/dL (ref 3.9–4.9)
Alkaline Phosphatase: 78 [IU]/L (ref 44–121)
BUN/Creatinine Ratio: 15 (ref 9–23)
BUN: 10 mg/dL (ref 6–24)
Bilirubin Total: 0.4 mg/dL (ref 0.0–1.2)
CO2: 23 mmol/L (ref 20–29)
Calcium: 9.6 mg/dL (ref 8.7–10.2)
Chloride: 102 mmol/L (ref 96–106)
Creatinine, Ser: 0.67 mg/dL (ref 0.57–1.00)
Globulin, Total: 2.4 g/dL (ref 1.5–4.5)
Glucose: 86 mg/dL (ref 70–99)
Potassium: 4.1 mmol/L (ref 3.5–5.2)
Sodium: 140 mmol/L (ref 134–144)
Total Protein: 7 g/dL (ref 6.0–8.5)
eGFR: 107 mL/min/{1.73_m2} (ref >59)

## 2023-10-29 LAB — HIV ANTIBODY (ROUTINE TESTING W REFLEX)

## 2023-10-29 LAB — CBC
Hematocrit: 43.2 % (ref 34.0–46.6)
Hemoglobin: 14 g/dL (ref 11.1–15.9)
MCH: 30.8 pg (ref 26.6–33.0)
MCHC: 32.4 g/dL (ref 31.5–35.7)
MCV: 95 fL (ref 79–97)
Platelets: 211 10*3/uL (ref 150–450)
RBC: 4.55 x10E6/uL (ref 3.77–5.28)
RDW: 12.3 % (ref 11.7–15.4)
WBC: 7.5 10*3/uL (ref 3.4–10.8)

## 2023-10-29 LAB — ESTROGENS, TOTAL: Estrogen: 87 pg/mL

## 2023-10-29 LAB — VITAMIN B12: Vitamin B-12: 429 pg/mL (ref 232–1245)

## 2023-10-29 LAB — TSH: TSH: 0.875 u[IU]/mL (ref 0.450–4.500)

## 2023-10-29 LAB — VITAMIN D 25 HYDROXY (VIT D DEFICIENCY, FRACTURES): Vit D, 25-Hydroxy: 31.1 ng/mL (ref 30.0–100.0)

## 2023-10-29 LAB — FSH/LH
FSH: 89.7 m[IU]/mL
LH: 39.7 m[IU]/mL

## 2023-10-30 NOTE — Progress Notes (Signed)
 Results sent through MyChart

## 2023-11-26 DIAGNOSIS — Z419 Encounter for procedure for purposes other than remedying health state, unspecified: Secondary | ICD-10-CM | POA: Diagnosis not present

## 2024-01-07 DIAGNOSIS — Z419 Encounter for procedure for purposes other than remedying health state, unspecified: Secondary | ICD-10-CM | POA: Diagnosis not present

## 2024-02-06 DIAGNOSIS — Z419 Encounter for procedure for purposes other than remedying health state, unspecified: Secondary | ICD-10-CM | POA: Diagnosis not present

## 2024-03-08 DIAGNOSIS — Z419 Encounter for procedure for purposes other than remedying health state, unspecified: Secondary | ICD-10-CM | POA: Diagnosis not present

## 2024-04-07 DIAGNOSIS — Z419 Encounter for procedure for purposes other than remedying health state, unspecified: Secondary | ICD-10-CM | POA: Diagnosis not present

## 2024-05-08 DIAGNOSIS — Z419 Encounter for procedure for purposes other than remedying health state, unspecified: Secondary | ICD-10-CM | POA: Diagnosis not present

## 2024-06-08 DIAGNOSIS — Z419 Encounter for procedure for purposes other than remedying health state, unspecified: Secondary | ICD-10-CM | POA: Diagnosis not present

## 2024-08-08 DIAGNOSIS — Z419 Encounter for procedure for purposes other than remedying health state, unspecified: Secondary | ICD-10-CM | POA: Diagnosis not present

## 2024-09-12 ENCOUNTER — Telehealth: Payer: Self-pay

## 2024-09-12 ENCOUNTER — Other Ambulatory Visit: Payer: Self-pay | Admitting: Medical

## 2024-09-12 DIAGNOSIS — Z1231 Encounter for screening mammogram for malignant neoplasm of breast: Secondary | ICD-10-CM

## 2024-09-12 NOTE — Telephone Encounter (Signed)
 Patient called and stated she was trying to schedule her yearly mammogram, was previously enrolled in [facility], not sure if medicaid is still active. I verified coverage was still active in [facility] Tracks system and active patient with pcp per 2025 notes in chart. Patient also stated she had Medicaid last year, had Medicaid coverage, received a bill which is now in collections. Patient informed to discuss refiling bill with medicaid when she calls [facility] to schedule mammogram. Patient verbalized understanding.

## 2024-10-05 ENCOUNTER — Ambulatory Visit
Admission: RE | Admit: 2024-10-05 | Discharge: 2024-10-05 | Disposition: A | Source: Ambulatory Visit | Attending: Medical | Admitting: Medical

## 2024-10-05 DIAGNOSIS — Z1231 Encounter for screening mammogram for malignant neoplasm of breast: Secondary | ICD-10-CM

## 2024-10-10 ENCOUNTER — Ambulatory Visit: Payer: Self-pay | Admitting: Medical

## 2024-12-03 ENCOUNTER — Encounter: Admitting: Medical
